# Patient Record
Sex: Male | Born: 1965 | Race: White | Hispanic: No | Marital: Married | Smoking: Former smoker
Health system: Southern US, Community
[De-identification: ages and names within clinical notes are randomized; demographics above are authoritative.]

## PROBLEM LIST (undated history)

## (undated) DIAGNOSIS — M5416 Radiculopathy, lumbar region: Secondary | ICD-10-CM

## (undated) DIAGNOSIS — K219 Gastro-esophageal reflux disease without esophagitis: Secondary | ICD-10-CM

## (undated) DIAGNOSIS — Z8614 Personal history of Methicillin resistant Staphylococcus aureus infection: Secondary | ICD-10-CM

## (undated) HISTORY — PX: LASIK: SHX215

## (undated) HISTORY — PX: CYSTOSCOPY W/ SPINCTEROTOMY: SUR378

---

## 2010-02-20 DIAGNOSIS — Z8614 Personal history of Methicillin resistant Staphylococcus aureus infection: Secondary | ICD-10-CM

## 2010-02-20 HISTORY — DX: Personal history of Methicillin resistant Staphylococcus aureus infection: Z86.14

## 2013-08-15 ENCOUNTER — Ambulatory Visit: Payer: Self-pay

## 2013-08-29 ENCOUNTER — Ambulatory Visit: Payer: Self-pay | Admitting: Family Medicine

## 2015-03-15 ENCOUNTER — Ambulatory Visit: Payer: Self-pay | Admitting: Family Medicine

## 2015-05-03 ENCOUNTER — Ambulatory Visit: Payer: Self-pay | Admitting: Family Medicine

## 2015-08-12 ENCOUNTER — Inpatient Hospital Stay: Admission: RE | Admit: 2015-08-12 | Payer: Self-pay | Source: Ambulatory Visit

## 2015-09-02 ENCOUNTER — Ambulatory Visit: Admission: RE | Admit: 2015-09-02 | Payer: BLUE CROSS/BLUE SHIELD | Source: Ambulatory Visit

## 2015-09-09 ENCOUNTER — Inpatient Hospital Stay: Admission: RE | Admit: 2015-09-09 | Payer: BLUE CROSS/BLUE SHIELD | Source: Ambulatory Visit

## 2015-09-09 ENCOUNTER — Encounter: Payer: Self-pay | Admitting: *Deleted

## 2015-09-10 ENCOUNTER — Encounter: Admission: RE | Payer: Self-pay | Source: Ambulatory Visit

## 2015-09-10 ENCOUNTER — Ambulatory Visit: Admission: RE | Admit: 2015-09-10 | Payer: BLUE CROSS/BLUE SHIELD | Source: Ambulatory Visit | Admitting: Surgery

## 2015-09-10 ENCOUNTER — Encounter: Payer: Self-pay | Admitting: *Deleted

## 2015-09-10 SURGERY — REPAIR, HERNIA, INGUINAL, ADULT
Anesthesia: Choice | Laterality: Bilateral

## 2015-09-10 SURGERY — REPAIR, HERNIA, INGUINAL, BILATERAL, ADULT
Anesthesia: Choice | Laterality: Bilateral

## 2015-09-10 NOTE — Patient Instructions (Signed)
  Your procedure is scheduled on: 09-16-15 [facility](THURSDAY) Report to Same Day Surgery 2nd floor medical mall To find out your arrival time please call [phone] between 1PM - 3PM on 09-15-15 [facility](WEDNESDAY)  Remember: Instructions that are not followed completely may result in serious medical risk, up to and including death, or upon the discretion of your surgeon and anesthesiologist your surgery may need to be rescheduled.    _x___ 1. Do not eat food or drink liquids after midnight. No gum chewing or hard candies.     __x__ 2. No Alcohol for 24 hours before or after surgery.   __x__3. No Smoking for 24 prior to surgery.   ____  4. Bring all medications with you on the day of surgery if instructed.    __x__ 5. Notify your doctor if there is any change in your medical condition     (cold, fever, infections).     Do not wear jewelry, make-up, hairpins, clips or nail polish.  Do not wear lotions, powders, or perfumes. You may wear deodorant.  Do not shave 48 hours prior to surgery. Men may shave face and neck.  Do not bring valuables to the hospital.    [facility] any belongings or valuables.               Contacts, dentures or bridgework may not be worn into surgery.  Leave your suitcase in the car. After surgery it may be brought to your room.  For patients admitted to the hospital, discharge time is determined by your treatment team.   Patients discharged the day of surgery will not be allowed to drive home.    Please read over the following fact sheets that you were given:   [facility] Preparing for [facility] and or MRSA Information   _x___ Take these medicines the morning of surgery with A SIP OF WATER:    1. ZANTAC  2. TAKE A ZANTAC AT BEDTIME ON Wednesday NIGHT  3.  4.  5.  6.  ____ Fleet Enema (as directed)   _x___ Use CHG Soap or sage wipes as directed on instruction sheet   ____ Use inhalers on the day of surgery and bring to hospital day of  surgery  ____ Stop metformin 2 days prior to surgery    ____ Take 1/2 of usual insulin dose the night before surgery and none on the morning of  surgery.   ____ Stop aspirin or coumadin, or plavix  _x__ Stop Anti-inflammatories such as Advil, Aleve, Ibuprofen, Motrin, Naproxen,          Naprosyn, Goodies powders or aspirin products. Ok to take Tylenol.   ____ Stop supplements until after surgery.    ____ Bring C-Pap to the hospital.

## 2015-09-14 ENCOUNTER — Inpatient Hospital Stay: Admission: RE | Admit: 2015-09-14 | Payer: BLUE CROSS/BLUE SHIELD | Source: Ambulatory Visit

## 2015-09-15 ENCOUNTER — Encounter
Admission: RE | Admit: 2015-09-15 | Discharge: 2015-09-15 | Disposition: A | Discharge status: Home / Self Care | Payer: BLUE CROSS/BLUE SHIELD | Source: Ambulatory Visit | Attending: Surgery | Admitting: Surgery

## 2015-09-15 DIAGNOSIS — Z79899 Other long term (current) drug therapy: Secondary | ICD-10-CM | POA: Diagnosis not present

## 2015-09-15 DIAGNOSIS — Z8614 Personal history of Methicillin resistant Staphylococcus aureus infection: Secondary | ICD-10-CM | POA: Diagnosis not present

## 2015-09-15 DIAGNOSIS — Z9889 Other specified postprocedural states: Secondary | ICD-10-CM | POA: Diagnosis not present

## 2015-09-15 DIAGNOSIS — Z87891 Personal history of nicotine dependence: Secondary | ICD-10-CM | POA: Diagnosis not present

## 2015-09-15 DIAGNOSIS — K4020 Bilateral inguinal hernia, without obstruction or gangrene, not specified as recurrent: Secondary | ICD-10-CM | POA: Diagnosis not present

## 2015-09-15 DIAGNOSIS — K219 Gastro-esophageal reflux disease without esophagitis: Secondary | ICD-10-CM | POA: Diagnosis not present

## 2015-09-15 LAB — SURGICAL PCR SCREEN
MRSA, PCR: NEGATIVE
Staphylococcus aureus: NEGATIVE

## 2015-09-16 ENCOUNTER — Ambulatory Visit: Payer: BLUE CROSS/BLUE SHIELD | Admitting: Anesthesiology

## 2015-09-16 ENCOUNTER — Ambulatory Visit
Admission: RE | Admit: 2015-09-16 | Discharge: 2015-09-16 | Disposition: A | Discharge status: Home / Self Care | Payer: BLUE CROSS/BLUE SHIELD | Source: Ambulatory Visit | Attending: Surgery | Admitting: Surgery

## 2015-09-16 ENCOUNTER — Encounter: Payer: Self-pay | Admitting: *Deleted

## 2015-09-16 ENCOUNTER — Encounter
Admission: RE | Disposition: A | Discharge status: Home / Self Care | Payer: Self-pay | Source: Ambulatory Visit | Attending: Surgery

## 2015-09-16 DIAGNOSIS — K219 Gastro-esophageal reflux disease without esophagitis: Secondary | ICD-10-CM | POA: Insufficient documentation

## 2015-09-16 DIAGNOSIS — Z8614 Personal history of Methicillin resistant Staphylococcus aureus infection: Secondary | ICD-10-CM | POA: Insufficient documentation

## 2015-09-16 DIAGNOSIS — Z87891 Personal history of nicotine dependence: Secondary | ICD-10-CM | POA: Insufficient documentation

## 2015-09-16 DIAGNOSIS — Z79899 Other long term (current) drug therapy: Secondary | ICD-10-CM | POA: Insufficient documentation

## 2015-09-16 DIAGNOSIS — K4020 Bilateral inguinal hernia, without obstruction or gangrene, not specified as recurrent: Secondary | ICD-10-CM | POA: Insufficient documentation

## 2015-09-16 DIAGNOSIS — Z9889 Other specified postprocedural states: Secondary | ICD-10-CM | POA: Insufficient documentation

## 2015-09-16 HISTORY — DX: Gastro-esophageal reflux disease without esophagitis: K21.9

## 2015-09-16 HISTORY — PX: INGUINAL HERNIA REPAIR: SHX194

## 2015-09-16 HISTORY — DX: Personal history of Methicillin resistant Staphylococcus aureus infection: Z86.14

## 2015-09-16 SURGERY — REPAIR, HERNIA, INGUINAL, BILATERAL, ADULT
Anesthesia: General | Laterality: Bilateral | Wound class: Clean

## 2015-09-16 MED ORDER — CEFAZOLIN SODIUM-DEXTROSE 2-4 GM/100ML-% IV SOLN
2.0000 g | Freq: Once | INTRAVENOUS | Status: AC
  Administered 2015-09-16: 2 g via INTRAVENOUS

## 2015-09-16 MED ORDER — LABETALOL HCL 5 MG/ML IV SOLN
INTRAVENOUS | Status: DC | PRN
  Administered 2015-09-16: 5 mg via INTRAVENOUS
  Administered 2015-09-16: 10 mg via INTRAVENOUS

## 2015-09-16 MED ORDER — HYDROCODONE-ACETAMINOPHEN 10-325 MG PO TABS
ORAL_TABLET | ORAL | Status: DC
Start: 2015-09-16 — End: 2015-09-16
  Filled 2015-09-16: qty 1

## 2015-09-16 MED ORDER — BUPIVACAINE-EPINEPHRINE (PF) 0.5% -1:200000 IJ SOLN
INTRAMUSCULAR | Status: DC | PRN
  Administered 2015-09-16 (×2): 15 mL via PERINEURAL

## 2015-09-16 MED ORDER — BUPIVACAINE-EPINEPHRINE (PF) 0.5% -1:200000 IJ SOLN
INTRAMUSCULAR | Status: AC
  Filled 2015-09-16: qty 30

## 2015-09-16 MED ORDER — GLYCOPYRROLATE 0.2 MG/ML IJ SOLN
INTRAMUSCULAR | Status: DC | PRN
  Administered 2015-09-16: 0.6 mg via INTRAVENOUS

## 2015-09-16 MED ORDER — ROCURONIUM BROMIDE 100 MG/10ML IV SOLN: INTRAVENOUS | Status: DC | PRN

## 2015-09-16 MED ORDER — FENTANYL CITRATE (PF) 100 MCG/2ML IJ SOLN
25.0000 ug | INTRAMUSCULAR | Status: AC | PRN
  Administered 2015-09-16 (×6): 25 ug via INTRAVENOUS

## 2015-09-16 MED ORDER — NEOSTIGMINE METHYLSULFATE 10 MG/10ML IV SOLN
INTRAVENOUS | Status: DC | PRN
  Administered 2015-09-16: 3 mg via INTRAVENOUS

## 2015-09-16 MED ORDER — HYDROCODONE-ACETAMINOPHEN 5-325 MG PO TABS: 1.0000 | ORAL_TABLET | ORAL | 0 refills | Status: DC | PRN

## 2015-09-16 MED ORDER — LIDOCAINE HCL (CARDIAC) 20 MG/ML IV SOLN
INTRAVENOUS | Status: DC | PRN
  Administered 2015-09-16: 40 mg via INTRAVENOUS

## 2015-09-16 MED ORDER — CEFAZOLIN SODIUM-DEXTROSE 2-4 GM/100ML-% IV SOLN
INTRAVENOUS | Status: AC
  Filled 2015-09-16: qty 100

## 2015-09-16 MED ORDER — ONDANSETRON HCL 4 MG/2ML IJ SOLN: 4.0000 mg | Freq: Once | INTRAMUSCULAR | Status: DC | PRN

## 2015-09-16 MED ORDER — ROCURONIUM BROMIDE 100 MG/10ML IV SOLN
INTRAVENOUS | Status: DC | PRN
  Administered 2015-09-16: 5 mg via INTRAVENOUS
  Administered 2015-09-16: 50 mg via INTRAVENOUS

## 2015-09-16 MED ORDER — LACTATED RINGERS IV SOLN
INTRAVENOUS | Status: DC
  Administered 2015-09-16 (×2): via INTRAVENOUS

## 2015-09-16 MED ORDER — PROPOFOL 10 MG/ML IV BOLUS
INTRAVENOUS | Status: DC | PRN
  Administered 2015-09-16: 200 mg via INTRAVENOUS

## 2015-09-16 MED ORDER — FENTANYL CITRATE (PF) 100 MCG/2ML IJ SOLN
INTRAMUSCULAR | Status: AC
  Administered 2015-09-16: 25 ug via INTRAVENOUS
  Filled 2015-09-16: qty 2

## 2015-09-16 MED ORDER — HYDROCODONE-ACETAMINOPHEN 5-325 MG PO TABS
1.0000 | ORAL_TABLET | ORAL | Status: DC | PRN
  Administered 2015-09-16: 1 via ORAL

## 2015-09-16 MED ORDER — MIDAZOLAM HCL 2 MG/2ML IJ SOLN
INTRAMUSCULAR | Status: DC | PRN
  Administered 2015-09-16: 2 mg via INTRAVENOUS

## 2015-09-16 MED ORDER — FENTANYL CITRATE (PF) 100 MCG/2ML IJ SOLN
INTRAMUSCULAR | Status: DC | PRN
  Administered 2015-09-16: 25 ug via INTRAVENOUS
  Administered 2015-09-16: 50 ug via INTRAVENOUS
  Administered 2015-09-16: 25 ug via INTRAVENOUS
  Administered 2015-09-16 (×2): 50 ug via INTRAVENOUS

## 2015-09-16 SURGICAL SUPPLY — 26 items
BLADE SURG 15 STRL LF DISP TIS (BLADE) ×1 IMPLANT
BLADE SURG 15 STRL SS (BLADE) ×1
CANISTER SUCT 1200ML W/VALVE (MISCELLANEOUS) ×2 IMPLANT
CHLORAPREP W/TINT 26ML (MISCELLANEOUS) ×2 IMPLANT
DRAIN PENROSE 5/8X18 LTX STRL (WOUND CARE) ×2 IMPLANT
DRAPE LAPAROTOMY 77X122 PED (DRAPES) ×2 IMPLANT
ELECT REM PT RETURN 9FT ADLT (ELECTROSURGICAL) ×2
ELECTRODE REM PT RTRN 9FT ADLT (ELECTROSURGICAL) ×1 IMPLANT
GLOVE BIO SURGEON STRL SZ7.5 (GLOVE) ×6 IMPLANT
GOWN STRL REUS W/ TWL LRG LVL3 (GOWN DISPOSABLE) ×3 IMPLANT
GOWN STRL REUS W/TWL LRG LVL3 (GOWN DISPOSABLE) ×3
KIT RM TURNOVER STRD PROC AR (KITS) ×2 IMPLANT
LABEL OR SOLS (LABEL) IMPLANT
LIQUID BAND (GAUZE/BANDAGES/DRESSINGS) ×4 IMPLANT
MESH SYNTHETIC 4X6 SOFT BARD (Mesh General) ×1 IMPLANT
MESH SYNTHETIC SOFT BARD 4X6 (Mesh General) ×1 IMPLANT
NEEDLE HYPO 25X1 1.5 SAFETY (NEEDLE) ×2 IMPLANT
NS IRRIG 500ML POUR BTL (IV SOLUTION) ×2 IMPLANT
PACK BASIN MINOR ARMC (MISCELLANEOUS) ×2 IMPLANT
SPONGE XRAY 4X4 16PLY STRL (MISCELLANEOUS) ×2 IMPLANT
SUT CHROMIC 4 0 RB 1X27 (SUTURE) ×4 IMPLANT
SUT MNCRL AB 4-0 PS2 18 (SUTURE) ×4 IMPLANT
SUT SURGILON 0 30 BLK (SUTURE) ×12 IMPLANT
SUT VIC AB 4-0 SH 27 (SUTURE) ×2
SUT VIC AB 4-0 SH 27XANBCTRL (SUTURE) ×2 IMPLANT
SYRINGE 10CC LL (SYRINGE) ×2 IMPLANT

## 2015-09-16 NOTE — Anesthesia Procedure Notes (Signed)
Date/Time: 09/16/2015 7:48 AM Performed by: Charna Busman

## 2015-09-16 NOTE — Progress Notes (Signed)
Pain level at

## 2015-09-16 NOTE — Anesthesia Preprocedure Evaluation (Signed)
Anesthesia Evaluation  Patient identified by MRN, date of birth, ID band Patient awake    Reviewed: Allergy & Precautions, NPO status , Patient's Chart, lab work & pertinent test results, reviewed documented beta blocker date and time   Airway Mallampati: II  TM Distance: >3 FB     Dental  (+) Chipped   Pulmonary former smoker,           Cardiovascular      Neuro/Psych    GI/Hepatic GERD  Controlled,  Endo/Other    Renal/GU      Musculoskeletal   Abdominal   Peds  Hematology   Anesthesia Other Findings   Reproductive/Obstetrics                             Anesthesia Physical Anesthesia Plan  ASA: II  Anesthesia Plan: General   Post-op Pain Management:    Induction: Intravenous  Airway Management Planned: LMA  Additional Equipment:   Intra-op Plan:   Post-operative Plan:   Informed Consent: I have reviewed the patients History and Physical, chart, labs and discussed the procedure including the risks, benefits and alternatives for the proposed anesthesia with the patient or authorized representative who has indicated his/her understanding and acceptance.     Plan Discussed with: CRNA  Anesthesia Plan Comments:         Anesthesia Quick Evaluation

## 2015-09-16 NOTE — H&P (Signed)
Russell Diaz is an 50 y.o. male.   Chief Complaint: Bulging in the right groin HPI: He recently came into the office complaining of bulging in the right groin. He had had minimal discomfort at that site. Physical findings demonstrated bilateral inguinal hernias. The bulge on the right was 4 cm and the bulge on the left was 3 cm. Is recommended for definitive treatment.  Past Medical History:  Diagnosis Date  . GERD (gastroesophageal reflux disease)   . Hx MRSA infection 2012    Past Surgical History:  Procedure Laterality Date  . CYSTOSCOPY W/ SPINCTEROTOMY      History reviewed. No pertinent family history. Social History:  reports that he quit smoking about 30 years ago. His smoking use included Cigarettes and E-cigarettes. He has a 10.00 pack-year smoking history. He quit smokeless tobacco use about 5 weeks ago. His smokeless tobacco use included Chew. He reports that he drinks alcohol. He reports that he does not use drugs.  Allergies: No Known Allergies  Medications Prior to Admission  Medication Sig Dispense Refill  . ranitidine (ZANTAC) 150 MG tablet Take 150 mg by mouth as needed for heartburn.      Results for orders placed or performed during the hospital encounter of 09/15/15 (from the past 48 hour(s))  Surgical pcr screen     Status: None   Collection Time: 09/15/15  1:16 PM  Result Value Ref Range   MRSA, PCR NEGATIVE NEGATIVE   Staphylococcus aureus NEGATIVE NEGATIVE    Comment:        The Xpert SA Assay (FDA approved for NASAL specimens in patients over 73 years of age), is one component of a comprehensive surveillance program.  Test performance has been validated by [facility] for patients greater than or equal to 67 year old. It is not intended to diagnose infection nor to guide or monitor treatment.    No results found.  ROS he reports no change in overall condition since last visit. He reports no difficulties breathing no cough cold or sore throat.  Review of systems otherwise negative.  Blood pressure (!) 138/100, pulse 78, temperature 97.8 F (36.6 C), temperature source Tympanic, resp. rate 18, height 5\' 10"  (1.778 m), weight 89.4 kg (197 lb), SpO2 98 %. Physical Exam  GENERAL:  Awake alert and oriented and in no acute distress.  LUNGS:  Clear without rales rhonchi or wheezes.  HEART:  Regular rhythm S1-S2, without murmur .  ABDOMEN: Hernias are currently reduced  NEUROLOGICAL: Awake alert moving all extremities  EXTREMITIES: No dependent edema  CLINICAL DATA: Lab work noted  Assessment/Plan Bilateral inguinal hernia repair. I have discussed the operation and care  Renda Rolls, MD 09/16/2015, 7:28 AM

## 2015-09-16 NOTE — Discharge Instructions (Addendum)
Take Tylenol or Norco if needed for pain. ° °Should not drive or do anything dangerous when taking Norco. ° °May shower and blot dry. ° °Avoid straining and heavy lifting. ° °AMBULATORY SURGERY  °DISCHARGE INSTRUCTIONS ° ° °1) The drugs that you were given will stay in your system until tomorrow so for the next 24 hours you should not: ° °A) Drive an automobile °B) Make any legal decisions °C) Drink any alcoholic beverage ° ° °2) You may resume regular meals tomorrow.  Today it is better to start with liquids and gradually work up to solid foods. ° °You may eat anything you prefer, but it is better to start with liquids, then soup and crackers, and gradually work up to solid foods. ° ° °3) Please notify your doctor immediately if you have any unusual bleeding, trouble breathing, redness and pain at the surgery site, drainage, fever, or pain not relieved by medication. ° °4) Additional Instructions: ° ° °Please contact your physician with any problems or Same Day Surgery at [phone], Monday through Friday 6 am to 4 pm, or  at [facility] Main number at [phone]. °

## 2015-09-16 NOTE — Anesthesia Procedure Notes (Signed)
Procedure Name: Intubation Date/Time: 09/16/2015 7:48 AM Performed by: Charna Busman Pre-anesthesia Checklist: Patient identified, Emergency Drugs available, Suction available, Patient being monitored and Timeout performed Patient Re-evaluated:Patient Re-evaluated prior to inductionOxygen Delivery Method: Circle system utilized Preoxygenation: Pre-oxygenation with 100% oxygen Intubation Type: Combination inhalational/ intravenous induction Ventilation: Mask ventilation without difficulty Laryngoscope Size: Miller and 3 Grade View: Grade II Tube type: Oral Tube size: 7.5 mm Number of attempts: 1 Airway Equipment and Method: Stylet Placement Confirmation: ETT inserted through vocal cords under direct vision,  positive ETCO2,  CO2 detector and breath sounds checked- equal and bilateral Secured at: 24 cm Tube secured with: Tape Dental Injury: Teeth and Oropharynx as per pre-operative assessment

## 2015-09-16 NOTE — Anesthesia Procedure Notes (Signed)
Performed by: Jhoana Upham       

## 2015-09-16 NOTE — Op Note (Signed)
OPERATIVE REPORT  PREOPERATIVE DIAGNOSIS: bilateral inguinal hernia  POSTOPERATIVE DIAGNOSIS:bilateral  inguinal hernia  PROCEDURE:  bilateral inguinal hernia repair  ANESTHESIA:  General  SURGEON:  Renda Rolls M.D.  INDICATIONS: He came in the office reporting bulging in the right groin. On physical exam he had findings of bilateral inguinal hernias somewhat larger on the right side. Surgery was recommended for definitive treatment.  With the patient on the operating table in the supine position the bilateral lower quadrant was prepared with clippers and with ChloraPrep and draped in a sterile manner. A transversely oriented right suprapubic incision was made and carried down through subcutaneous tissues. Electrocautery was used for hemostasis. The Scarpa's fascia was incised. The external oblique aponeurosis was incised along the course of its fibers to open the external ring and expose the inguinal cord structures. The cord structures were mobilized. A Penrose drain was passed around the cord structures for traction. Cremaster fibers were separated and examine the cord structures. There was no indirect hernia. There was however a finding of a direct inguinal hernia the defect itself was approximately 2.8 cm. A circumferential incision was made in the attenuated transversalis fascia which was removed. The hernia was inverted. The repair was carried out with 0 Surgilon sutures suturing the conjoined tendon to the shelving edge of the inguinal ligament incorporating transversalis fascia into the repair. The last stitch led to satisfactory narrowing of the internal ring.  Bard soft mesh was cut to create an oval shape and was placed over the repair. This was sutured to the repair with interrupted 0 Surgilon sutures and also sutured medially to the deep fascia and on both sides of the internal ring. Next after seeing hemostasis was intact the cord structures were replaced along the floor of the inguinal  canal. The cut edges of the external oblique aponeurosis were closed with a running 4-0 Vicryl suture to re-create the external ring. The deep fascia superior and lateral to the repair site was infiltrated with half percent Sensorcaine with epinephrine. Subcutaneous tissues were also infiltrated. The Scarpa's fascia was closed with interrupted 4-0 Vicryl sutures. The skin was closed with running 4-0 Monocryl subcuticular suture and LiquiBand.   Attention was turned to the left side. A transversely oriented left suprapubic incision was made and carried down through subcutaneous tissues.Electrocautery was used for hemostasis. The Scarpa's fascia was incised. The external oblique aponeurosis was incised along the course of its fibers to open the external ring and expose the inguinal cord structures. The cord structures were mobilized. A Penrose drain was passed around the cord structures for traction. Cremaster fibers were separated and examine the cord structures. Cremaster fibers were separated. There was an indirect hernia sac containing mostly fatty tissue which was dissected free from surrounding structures and was inverted back into the peritoneal cavity.The repair was carried out with 0 Surgilon sutures suturing the conjoined tendon to the shelving edge of the inguinal ligament incorporating transversalis fascia into the repair. The last stitch led to satisfactory narrowing of the internal ring.  Bard soft mesh was cut to create an oval shape and was placed over the repair. This was sutured to the repair with interrupted 0 Surgilon sutures and also sutured medially to the deep fascia and on both sides of the internal ring. Next after seeing hemostasis was intact the cord structures were replaced along the floor of the inguinal canal. The cut edges of the external oblique aponeurosis were closed with a running 4-0 Vicryl suture to re-create the  external ring. The deep fascia superior and lateral to the repair  site was infiltrated with half percent Sensorcaine with epinephrine. Subcutaneous tissues were also infiltrated. The Scarpa's fascia was closed with interrupted 4-0 Vicryl sutures. The skin was closed with running 4-0 Monocryl subcuticular suture and LiquiBand. The testicles remained in the scrotum.   The patient appeared to be in satisfactory condition and was prepared for transfer to the recovery room.  Renda Rolls M.D.

## 2015-09-16 NOTE — Progress Notes (Signed)
Dr. Smith into see 

## 2015-09-16 NOTE — Transfer of Care (Signed)
Immediate Anesthesia Transfer of Care Note  Patient: Russell Diaz  Procedure(s) Performed: Procedure(s): HERNIA REPAIR INGUINAL ADULT BILATERAL (Bilateral)  Patient Location: PACU  Anesthesia Type:General  Level of Consciousness: awake, oriented and patient cooperative  Airway & Oxygen Therapy: Patient Spontanous Breathing  Post-op Assessment: Report given to RN and Post -op Vital signs reviewed and stable  Post vital signs: Reviewed and stable  Last Vitals:  Vitals:   09/16/15 0649 09/16/15 1046  BP: (!) 138/100 (!) 133/101  Pulse: 78 97  Resp: 18 15  Temp: 36.6 C 36.9 C    Last Pain:  Vitals:   09/16/15 0649  TempSrc: Tympanic  PainSc: 7          Complications: No apparent anesthesia complications

## 2015-09-16 NOTE — Anesthesia Postprocedure Evaluation (Signed)
Anesthesia Post Note  Patient: Russell Diaz  Procedure(s) Performed: Procedure(s) (LRB): HERNIA REPAIR INGUINAL ADULT BILATERAL (Bilateral)  Patient location during evaluation: PACU Anesthesia Type: General Level of consciousness: awake and alert Pain management: pain level controlled Vital Signs Assessment: post-procedure vital signs reviewed and stable Respiratory status: spontaneous breathing, nonlabored ventilation, respiratory function stable and patient connected to nasal cannula oxygen Cardiovascular status: blood pressure returned to baseline and stable Postop Assessment: no signs of nausea or vomiting Anesthetic complications: no    Last Vitals:  Vitals:   09/16/15 1148 09/16/15 1200  BP: (!) 133/94 131/80  Pulse: 67 78  Resp: 16   Temp:      Last Pain:  Vitals:   09/16/15 1146  TempSrc: Tympanic  PainSc: 4                  Viveca Beckstrom S

## 2015-09-17 ENCOUNTER — Ambulatory Visit: Payer: Self-pay | Admitting: Family Medicine

## 2016-07-14 ENCOUNTER — Ambulatory Visit (INDEPENDENT_AMBULATORY_CARE_PROVIDER_SITE_OTHER): Payer: BC Managed Care – PPO | Admitting: Internal Medicine

## 2016-07-14 ENCOUNTER — Encounter: Payer: Self-pay | Admitting: Internal Medicine

## 2016-07-14 VITALS — BP 124/82 | HR 68 | Temp 97.9°F | Ht 68.25 in | Wt 201.5 lb

## 2016-07-14 DIAGNOSIS — M79605 Pain in left leg: Secondary | ICD-10-CM

## 2016-07-14 DIAGNOSIS — M79604 Pain in right leg: Secondary | ICD-10-CM | POA: Diagnosis not present

## 2016-07-14 DIAGNOSIS — K219 Gastro-esophageal reflux disease without esophagitis: Secondary | ICD-10-CM | POA: Diagnosis not present

## 2016-07-14 NOTE — Patient Instructions (Signed)
Food Choices for Gastroesophageal Reflux Disease, Adult When you have gastroesophageal reflux disease (GERD), the foods you eat and your eating habits are very important. Choosing the right foods can help ease your discomfort. What guidelines do I need to follow?  Choose fruits, vegetables, whole grains, and low-fat dairy products.  Choose low-fat meat, fish, and poultry.  Limit fats such as oils, salad dressings, butter, nuts, and avocado.  Keep a food diary. This helps you identify foods that cause symptoms.  Avoid foods that cause symptoms. These may be different for everyone.  Eat small meals often instead of 3 large meals a day.  Eat your meals slowly, in a place where you are relaxed.  Limit fried foods.  Cook foods using methods other than frying.  Avoid drinking alcohol.  Avoid drinking large amounts of liquids with your meals.  Avoid bending over or lying down until 2-3 hours after eating. What foods are not recommended? These are some foods and drinks that may make your symptoms worse: Vegetables  Tomatoes. Tomato juice. Tomato and spaghetti sauce. Chili peppers. Onion and garlic. Horseradish. Fruits  Oranges, grapefruit, and lemon (fruit and juice). Meats  High-fat meats, fish, and poultry. This includes hot dogs, ribs, ham, sausage, salami, and bacon. Dairy  Whole milk and chocolate milk. Sour cream. Cream. Butter. Ice cream. Cream cheese. Drinks  Coffee and tea. Bubbly (carbonated) drinks or energy drinks. Condiments  Hot sauce. Barbecue sauce. Sweets/Desserts  Chocolate and cocoa. Donuts. Peppermint and spearmint. Fats and Oils  High-fat foods. This includes French fries and potato chips. Other  Vinegar. Strong spices. This includes black pepper, white pepper, red pepper, cayenne, curry powder, cloves, ginger, and chili powder. The items listed above may not be a complete list of foods and drinks to avoid. Contact your dietitian for more information.    This information is not intended to replace advice given to you by your health care provider. Make sure you discuss any questions you have with your health care provider. Document Released: 08/08/2011 Document Revised: 07/15/2015 Document Reviewed: 12/11/2012 Elsevier Interactive Patient Education  2017 [employer]  

## 2016-07-14 NOTE — Progress Notes (Signed)
HPI  Pt presents to the clinic today to establish care and for management of the conditions listed below. [facility]  GERD: He is not sure triggers this. He takes Zantac as needed with good relief.  He also c/o bilateral leg pain. He reports this started in the last few weeks. He describes the pain as sore and achy. He denies numbness or tingling in his feet. The pain is not worse with ambulation. It is better with stretching. He has tried Constellation Brandsoody's Powders with some relief.  Flu: 11/2014 Tetanus: unsure PSA screening: never Colon Screening: never Vision Screening: as needed Dentist: as needed  Past Medical History:  Diagnosis Date  . GERD (gastroesophageal reflux disease)   . Hx MRSA infection 2012    Current Outpatient Prescriptions  Medication Sig Dispense Refill  . ranitidine (ZANTAC) 150 MG tablet Take 150 mg by mouth as needed for heartburn.     No current facility-administered medications for this visit.     No Known Allergies  Family History  Problem Relation Age of Onset  . Heart disease Father   . Diabetes Paternal Uncle     Social History   Social History  . Marital status: Married    Spouse name: N/A  . Number of children: N/A  . Years of education: N/A   Occupational History  . Not on file.   Social History Main Topics  . Smoking status: Former Smoker    Packs/day: 1.00    Years: 10.00    Types: Cigarettes, E-cigarettes    Quit date: 09/09/1985  . Smokeless tobacco: Former NeurosurgeonUser    Types: Chew    Quit date: 08/11/2015     Comment: Last used e Cigarette 09/15/15  . Alcohol use Yes     Comment: OCC  . Drug use: No  . Sexual activity: Yes   Other Topics Concern  . Not on file   Social History Narrative  . No narrative on file    ROS:  Constitutional: Denies fever, malaise, fatigue, headache or abrupt weight changes.  HEENT: Denies eye pain, eye redness, ear pain, ringing in the ears, wax buildup, runny  nose, nasal congestion, bloody nose, or sore throat. Respiratory: Denies difficulty breathing, shortness of breath, cough or sputum production.   Cardiovascular: Denies chest pain, chest tightness, palpitations or swelling in the hands or feet.  Gastrointestinal: Pt reports intermittent reflux. Denies abdominal pain, bloating, constipation, diarrhea or blood in the stool.  GU: Denies frequency, urgency, pain with urination, blood in urine, odor or discharge. Musculoskeletal: Pt reports bilateral leg pain. Denies decrease in range of motion, difficulty with gait, or joint pain and swelling.  Skin: Denies redness, rashes, lesions or ulcercations.  Neurological: Denies dizziness, difficulty with memory, difficulty with speech or problems with balance and coordination.  Psych: Denies anxiety, depression, SI/HI.  No other specific complaints in a complete review of systems (except as listed in HPI above).  PE:  BP 124/82   Pulse 68   Temp 97.9 F (36.6 C) (Oral)   Ht 5' 8.25" (1.734 m)   Wt 201 lb 8 oz (91.4 kg)   SpO2 96%   BMI 30.41 kg/m  Wt Readings from Last 3 Encounters:  07/14/16 201 lb 8 oz (91.4 kg)  09/16/15 197 lb (89.4 kg)    General: Appears his stated age, well developed, well nourished in NAD. Cardiovascular: Normal rate and rhythm. S1,S2 noted.  No murmur, rubs or gallops noted. No JVD  or BLE edema. No carotid bruits noted. Pulmonary/Chest: Normal effort and positive vesicular breath sounds. No respiratory distress. No wheezes, rales or ronchi noted.  Abdomen: Soft and nontender. Normal bowel sounds. Musculoskeletal: Normal flexion and extension of the knees. No joint swelling noted. Pain with palpation of bilateral calves. No redness, swelling or warmth noted of the calves. No difficulty with gait.  Neurological: Alert and oriented.  Psychiatric: Mood and affect normal. Behavior is normal. Judgment and thought content normal.    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

## 2016-07-14 NOTE — Assessment & Plan Note (Signed)
Continue prn Zantac

## 2016-09-07 ENCOUNTER — Ambulatory Visit (INDEPENDENT_AMBULATORY_CARE_PROVIDER_SITE_OTHER): Payer: BC Managed Care – PPO | Admitting: Internal Medicine

## 2016-09-07 ENCOUNTER — Encounter: Payer: Self-pay | Admitting: Internal Medicine

## 2016-09-07 ENCOUNTER — Encounter (INDEPENDENT_AMBULATORY_CARE_PROVIDER_SITE_OTHER): Payer: Self-pay

## 2016-09-07 VITALS — BP 122/76 | HR 61 | Temp 97.9°F | Ht 68.5 in | Wt 201.5 lb

## 2016-09-07 DIAGNOSIS — Z23 Encounter for immunization: Secondary | ICD-10-CM | POA: Diagnosis not present

## 2016-09-07 DIAGNOSIS — Z125 Encounter for screening for malignant neoplasm of prostate: Secondary | ICD-10-CM | POA: Diagnosis not present

## 2016-09-07 DIAGNOSIS — E7800 Pure hypercholesterolemia, unspecified: Secondary | ICD-10-CM | POA: Diagnosis not present

## 2016-09-07 DIAGNOSIS — Z0001 Encounter for general adult medical examination with abnormal findings: Secondary | ICD-10-CM

## 2016-09-07 DIAGNOSIS — K5901 Slow transit constipation: Secondary | ICD-10-CM

## 2016-09-07 LAB — CBC
HCT: 45.6 % (ref 39.0–52.0)
Hemoglobin: 15.6 g/dL (ref 13.0–17.0)
MCHC: 34.3 g/dL (ref 30.0–36.0)
MCV: 87.2 fl (ref 78.0–100.0)
Platelets: 216 10*3/uL (ref 150.0–400.0)
RBC: 5.23 Mil/uL (ref 4.22–5.81)
RDW: 13.6 % (ref 11.5–15.5)
WBC: 5.1 10*3/uL (ref 4.0–10.5)

## 2016-09-07 LAB — COMPREHENSIVE METABOLIC PANEL
ALBUMIN: 4.4 g/dL (ref 3.5–5.2)
ALT: 21 U/L (ref 0–53)
AST: 18 U/L (ref 0–37)
Alkaline Phosphatase: 65 U/L (ref 39–117)
BUN: 18 mg/dL (ref 6–23)
CHLORIDE: 105 meq/L (ref 96–112)
CO2: 28 meq/L (ref 19–32)
CREATININE: 1.05 mg/dL (ref 0.40–1.50)
Calcium: 9.5 mg/dL (ref 8.4–10.5)
GFR: 79.39 mL/min (ref >60.00)
Glucose, Bld: 96 mg/dL (ref 70–99)
Potassium: 4.6 mEq/L (ref 3.5–5.1)
SODIUM: 138 meq/L (ref 135–145)
Total Bilirubin: 0.6 mg/dL (ref 0.2–1.2)
Total Protein: 6.3 g/dL (ref 6.0–8.3)

## 2016-09-07 LAB — PSA: PSA: 0.65 ng/mL (ref 0.10–4.00)

## 2016-09-07 LAB — LIPID PANEL
CHOL/HDL RATIO: 6
Cholesterol: 244 mg/dL — ABNORMAL HIGH (ref 0–200)
HDL: 40 mg/dL (ref >39.00)
LDL CALC: 180 mg/dL — AB (ref 0–99)
NonHDL: 203.97
Triglycerides: 120 mg/dL (ref 0.0–149.0)
VLDL: 24 mg/dL (ref 0.0–40.0)

## 2016-09-07 LAB — HEMOGLOBIN A1C: HEMOGLOBIN A1C: 5.5 % (ref 4.6–6.5)

## 2016-09-07 NOTE — Addendum Note (Signed)
Addended by: Roena MaladyEVONTENNO, Patrick Salemi Y on: 09/07/2016 02:25 PM   Modules accepted: Orders

## 2016-09-07 NOTE — Progress Notes (Signed)
Subjective:    Patient ID: Russell Diaz, male    DOB: 1965/09/19, 51 y.o.   MRN: [id number]  HPI  Pt presents to the clinic today for his annual exam.  Flu: 11/2014 Tetanus: unsure PSA Screening: never Colon Screening: never Vision Screening: as needed Dentist: as needed  Diet: He is eating meat. He consumes some veggies no fruits. He does not eat fried foods but he does eat high fat foods. He drinks mostly water and coffee Exercise: None  Review of Systems  Past Medical History:  Diagnosis Date  . GERD (gastroesophageal reflux disease)   . Hx MRSA infection 2012    Current Outpatient Prescriptions  Medication Sig Dispense Refill  . ranitidine (ZANTAC) 150 MG tablet Take 150 mg by mouth as needed for heartburn.     No current facility-administered medications for this visit.     No Known Allergies  Family History  Problem Relation Age of Onset  . Heart disease Father   . Diabetes Paternal Uncle     Social History   Social History  . Marital status: Married    Spouse name: N/A  . Number of children: N/A  . Years of education: N/A   Occupational History  . Not on file.   Social History Main Topics  . Smoking status: Former Smoker    Packs/day: 1.00    Years: 10.00    Types: Cigarettes, E-cigarettes    Quit date: 09/09/1985  . Smokeless tobacco: Former NeurosurgeonUser    Types: Chew    Quit date: 08/11/2015     Comment: Last used e Cigarette 09/15/15  . Alcohol use Yes     Comment: OCC  . Drug use: No  . Sexual activity: Yes   Other Topics Concern  . Not on file   Social History Narrative  . No narrative on file     Constitutional: Denies fever, malaise, fatigue, headache or abrupt weight changes.  HEENT: Denies eye pain, eye redness, ear pain, ringing in the ears, wax buildup, runny nose, nasal congestion, bloody nose, or sore throat. Respiratory: Denies difficulty breathing, shortness of breath, cough or sputum production.   Cardiovascular: Denies  chest pain, chest tightness, palpitations or swelling in the hands or feet.  Gastrointestinal: Pt reports constipation. Denies abdominal pain, bloating, diarrhea or blood in the stool.  GU: Denies urgency, frequency, pain with urination, burning sensation, blood in urine, odor or discharge. Musculoskeletal: Pt reports intermittent bilateral leg pain (only when driving). Denies decrease in range of motion, difficulty with gait, muscle pain or joint pain and swelling.  Skin: Denies redness, rashes, lesions or ulcercations.  Neurological: Denies dizziness, difficulty with memory, difficulty with speech or problems with balance and coordination.  Psych: Denies anxiety, depression, SI/HI.  No other specific complaints in a complete review of systems (except as listed in HPI above).     Objective:   Physical Exam   BP 122/76   Pulse 61   Temp 97.9 F (36.6 C) (Oral)   Ht 5' 8.5" (1.74 m)   Wt 201 lb 8 oz (91.4 kg)   SpO2 96%   BMI 30.19 kg/m  Wt Readings from Last 3 Encounters:  09/07/16 201 lb 8 oz (91.4 kg)  07/14/16 201 lb 8 oz (91.4 kg)  09/16/15 197 lb (89.4 kg)    General: Appears his stated age, well developed, well nourished in NAD. Skin: Warm, dry and intact. Acne and acne scars noted on face. HEENT: Head: normal shape and  size; Eyes: sclera white, no icterus, conjunctiva pink, PERRLA and EOMs intact; Ears: Tm's gray and intact, normal light reflex;  Throat/Mouth: Teeth present, mucosa pink and moist, no exudate, lesions or ulcerations noted.  Neck:  Neck supple, trachea midline. No masses, lumps or thyromegaly present.  Cardiovascular: Normal rate and rhythm. S1,S2 noted.  No murmur, rubs or gallops noted. No JVD or BLE edema. No carotid bruits noted. Pulmonary/Chest: Normal effort and positive vesicular breath sounds. No respiratory distress. No wheezes, rales or ronchi noted.  Abdomen: Soft and nontender. Normal bowel sounds. No distention or masses noted. Liver, spleen and  kidneys non palpable. Musculoskeletal: Strength 5/5 BUE/BLE. No difficulty with gait.  Neurological: Alert and oriented. Cranial nerves II-XII grossly intact. Coordination normal.  Psychiatric: Mood and affect normal. Behavior is normal. Judgment and thought content normal.       Assessment & Plan:   Preventative Health Maintenance:  Encouraged him to get a flu shot in the fall Tdap today He declines colonoscopy but is agreeable to Cologuard- ordered Encouraged him to consume a balanced diet and exercise regimen Advised him to see an eye doctor and dentist annually Will check CBC, CMET, Lipid, A1C and PSA today He declines STD screening  Constipation:  Secondary to Keto diet Advised him to drink plenty of water Start a probiotic daily or Mirilax every other day  RTC in 1 year, sooner if needed Nicki Reaper, NP

## 2016-09-07 NOTE — Patient Instructions (Signed)

## 2016-09-11 NOTE — Addendum Note (Signed)
Addended by: Roena MaladyEVONTENNO, Shakora Nordquist Y on: 09/11/2016 10:18 AM   Modules accepted: Orders

## 2016-09-14 ENCOUNTER — Telehealth: Payer: Self-pay

## 2016-09-14 NOTE — Telephone Encounter (Signed)
Cologuard form has been faxed w/ demographics and insurance card attached 

## 2016-09-30 LAB — COLOGUARD

## 2016-10-04 ENCOUNTER — Telehealth: Payer: Self-pay

## 2016-10-04 NOTE — Telephone Encounter (Signed)
Letter mailed to pt letting him know his recent results from Cologuard are negative

## 2016-10-12 ENCOUNTER — Encounter: Payer: Self-pay | Admitting: Internal Medicine

## 2016-12-12 ENCOUNTER — Ambulatory Visit (INDEPENDENT_AMBULATORY_CARE_PROVIDER_SITE_OTHER): Payer: BC Managed Care – PPO

## 2016-12-12 ENCOUNTER — Other Ambulatory Visit (INDEPENDENT_AMBULATORY_CARE_PROVIDER_SITE_OTHER): Payer: BC Managed Care – PPO

## 2016-12-12 DIAGNOSIS — E7800 Pure hypercholesterolemia, unspecified: Secondary | ICD-10-CM | POA: Diagnosis not present

## 2016-12-12 DIAGNOSIS — Z23 Encounter for immunization: Secondary | ICD-10-CM | POA: Diagnosis not present

## 2016-12-13 LAB — LIPID PANEL
CHOLESTEROL: 244 mg/dL — AB (ref 0–200)
HDL: 43.8 mg/dL (ref >39.00)
LDL Cholesterol: 170 mg/dL — ABNORMAL HIGH (ref 0–99)
NonHDL: 199.96
TRIGLYCERIDES: 151 mg/dL — AB (ref 0.0–149.0)
Total CHOL/HDL Ratio: 6
VLDL: 30.2 mg/dL (ref 0.0–40.0)

## 2016-12-14 ENCOUNTER — Other Ambulatory Visit: Payer: Self-pay | Admitting: Internal Medicine

## 2016-12-14 ENCOUNTER — Encounter: Payer: Self-pay | Admitting: Internal Medicine

## 2016-12-14 DIAGNOSIS — E7800 Pure hypercholesterolemia, unspecified: Secondary | ICD-10-CM

## 2016-12-14 MED ORDER — SIMVASTATIN 20 MG PO TABS: 20.0000 mg | ORAL_TABLET | Freq: Every day | ORAL | 2 refills | Status: DC

## 2016-12-20 ENCOUNTER — Ambulatory Visit: Payer: Self-pay

## 2016-12-20 NOTE — Telephone Encounter (Signed)
Pt. Has appointment for 12/21/16 at 4 p.m. With Dr. Ermalene SearingBedsole. Pt instructed if chest pain returns and last more than 3-5 minutes and he has any other symptoms with this to call 911. Verbalizes understanding.

## 2016-12-20 NOTE — Telephone Encounter (Signed)
   Reason for Disposition . [1] Chest pain lasting <= 5 minutes AND [2] NO chest pain or cardiac symptoms now(Exceptions: pains lasting a few seconds)  Answer Assessment - Initial Assessment Questions 1. LOCATION: "Where does it hurt?"       No pain now. 2. RADIATION: "Does the pain go anywhere else?" (e.g., into neck, jaw, arms, back)     Left rib area 3. ONSET: "When did the chest pain begin?" (Minutes, hours or days)      Over I month ago 4. PATTERN "Does the pain come and go, or has it been constant since it started?"  "Does it get worse with exertion?"      Pain comes and goes 5. DURATION: "How long does it last" (e.g., seconds, minutes, hours)     10-15 seconds 6. SEVERITY: "How bad is the pain?"  (e.g., Scale 1-10; mild, moderate, or severe)    - MILD (1-3): doesn't interfere with normal activities     - MODERATE (4-7): interferes with normal activities or awakens from sleep    - SEVERE (8-10): excruciating pain, unable to do any normal activities       3-4 7. CARDIAC RISK FACTORS: "Do you have any history of heart problems or risk factors for heart disease?" (e.g., prior heart attack, angina; high blood pressure, diabetes, being overweight, high cholesterol, smoking, or strong family history of heart disease)     High cholesterol 8. PULMONARY RISK FACTORS: "Do you have any history of lung disease?"  (e.g., blood clots in lung, asthma, emphysema, birth control pills)     None 9. CAUSE: "What do you think is causing the chest pain?"     No ideas 10. OTHER SYMPTOMS: "Do you have any other symptoms?" (e.g., dizziness, nausea, vomiting, sweating, fever, difficulty breathing, cough)       None 11. PREGNANCY: "Is there any chance you are pregnant?" "When was your last menstrual period?"       No  Protocols used: CHEST PAIN-A-AH  Pt. States chest pain started about the time he started using vaping to stop smoking about I month ago. States he is very concerned about this. Denies any  other symptoms.

## 2016-12-22 ENCOUNTER — Encounter: Payer: Self-pay | Admitting: Family Medicine

## 2016-12-22 ENCOUNTER — Ambulatory Visit (INDEPENDENT_AMBULATORY_CARE_PROVIDER_SITE_OTHER): Payer: BC Managed Care – PPO | Admitting: Family Medicine

## 2016-12-22 VITALS — BP 120/82 | HR 64 | Temp 97.6°F | Ht 68.5 in | Wt 196.5 lb

## 2016-12-22 DIAGNOSIS — R079 Chest pain, unspecified: Secondary | ICD-10-CM

## 2016-12-22 DIAGNOSIS — K219 Gastro-esophageal reflux disease without esophagitis: Secondary | ICD-10-CM | POA: Diagnosis not present

## 2016-12-22 NOTE — Progress Notes (Signed)
   Subjective:    Patient ID: Russell Diaz, male    DOB: [birth year], 51 y.o.   MRN: [id number]  HPI   51 year old male presents with new onset chest pain x 1 month.  Intermittent,  central chest pain, occ under ribs on left, occ under left arm last  Few seconds, occurs spontaneous.. At rest, occasionally when driving  no change with eating,  No change with arms or head movements.  No SOB, no sweating,  no associated panic  no cough  When exercising he does fine.Marland Kitchen. Has been walking a lot   Occ associated with hear fluttering/palpitations  hx of GERD on zantac prn.  drinks 2 large cups of coffee a day   CV risk:  tobacco: former smoker, but does occ dip has recently started vaping again  family history: father with  MI age 50, MGF CAD  personal history:  No PMH MI/CAD  cholesterol: now on statin has some associated muscle ache.  EKG: normal EKG, normal sinus rhythm, nonspecific ST and T waves changes.   Review of Systems  Constitutional: Negative for fatigue and fever.  HENT: Negative for ear pain.   Eyes: Negative for pain.  Respiratory: Negative for cough and shortness of breath.   Cardiovascular: Positive for chest pain. Negative for palpitations and leg swelling.  Gastrointestinal: Negative for abdominal pain.  Genitourinary: Negative for dysuria.  Musculoskeletal: Negative for arthralgias.  Neurological: Negative for syncope, light-headedness and headaches.  Psychiatric/Behavioral: Negative for dysphoric mood.       Objective:   Physical Exam  Constitutional: Vital signs are normal. He appears well-developed and well-nourished.  HENT:  Head: Normocephalic.  Right Ear: Hearing normal.  Left Ear: Hearing normal.  Nose: Nose normal.  Mouth/Throat: Oropharynx is clear and moist and mucous membranes are normal.  Neck: Trachea normal. Carotid bruit is not present. No thyroid mass and no thyromegaly present.  Cardiovascular: Normal rate, regular rhythm and normal  pulses. Exam reveals no gallop, no distant heart sounds and no friction rub.  No murmur heard. No peripheral edema  Pulmonary/Chest: Effort normal and breath sounds normal. No respiratory distress.  Skin: Skin is warm, dry and intact. No rash noted.  Psychiatric: He has a normal mood and affect. His speech is normal and behavior is normal. Thought content normal.          Assessment & Plan:

## 2016-12-22 NOTE — Patient Instructions (Addendum)
Start prilosec 20 mg daily 2-4 weeks.  Please stop at the front desk to set up referral. Decrease caffeine  Or change to decaf.  No sign of past or ongoing heart attack on EKG.

## 2016-12-26 ENCOUNTER — Ambulatory Visit (INDEPENDENT_AMBULATORY_CARE_PROVIDER_SITE_OTHER): Payer: BC Managed Care – PPO

## 2016-12-26 DIAGNOSIS — R079 Chest pain, unspecified: Secondary | ICD-10-CM | POA: Diagnosis not present

## 2016-12-29 LAB — EXERCISE TOLERANCE TEST
CHL CUP RESTING HR STRESS: 70 {beats}/min
CSEPED: 8 min
CSEPEW: 10.1 METS
Exercise duration (sec): 19 s
MPHR: 170 {beats}/min
Peak HR: 157 {beats}/min
Percent HR: 92 %

## 2017-01-22 DIAGNOSIS — R079 Chest pain, unspecified: Secondary | ICD-10-CM | POA: Insufficient documentation

## 2017-01-22 NOTE — Assessment & Plan Note (Signed)
Atypical. Not clearly cardiac but pt with moderate CAD risk.Marland Kitchen. Refer for cardiac stress testing.  More likely due to GERD treat with PPI and trigger avoidance.

## 2017-01-22 NOTE — Assessment & Plan Note (Signed)
Start prilosec 20 mg daily 2-4 weeks.  Decrease caffeine  Or change to decaf.

## 2017-03-06 ENCOUNTER — Other Ambulatory Visit: Payer: Self-pay | Admitting: Internal Medicine

## 2017-03-06 DIAGNOSIS — E7800 Pure hypercholesterolemia, unspecified: Secondary | ICD-10-CM

## 2017-03-16 ENCOUNTER — Other Ambulatory Visit: Payer: BC Managed Care – PPO

## 2017-09-10 ENCOUNTER — Encounter: Payer: BC Managed Care – PPO | Admitting: Internal Medicine

## 2017-09-13 ENCOUNTER — Ambulatory Visit (INDEPENDENT_AMBULATORY_CARE_PROVIDER_SITE_OTHER): Payer: BC Managed Care – PPO | Admitting: Internal Medicine

## 2017-09-13 ENCOUNTER — Encounter: Payer: Self-pay | Admitting: Internal Medicine

## 2017-09-13 VITALS — BP 122/78 | HR 69 | Temp 97.8°F | Ht 68.5 in | Wt 186.0 lb

## 2017-09-13 DIAGNOSIS — Z125 Encounter for screening for malignant neoplasm of prostate: Secondary | ICD-10-CM

## 2017-09-13 DIAGNOSIS — Z0000 Encounter for general adult medical examination without abnormal findings: Secondary | ICD-10-CM

## 2017-09-13 DIAGNOSIS — G8929 Other chronic pain: Secondary | ICD-10-CM | POA: Insufficient documentation

## 2017-09-13 DIAGNOSIS — M545 Low back pain, unspecified: Secondary | ICD-10-CM

## 2017-09-13 DIAGNOSIS — E7800 Pure hypercholesterolemia, unspecified: Secondary | ICD-10-CM | POA: Diagnosis not present

## 2017-09-13 DIAGNOSIS — K219 Gastro-esophageal reflux disease without esophagitis: Secondary | ICD-10-CM

## 2017-09-13 DIAGNOSIS — M549 Dorsalgia, unspecified: Secondary | ICD-10-CM

## 2017-09-13 NOTE — Assessment & Plan Note (Addendum)
CBC and CMET today Improved with weight loss Can take Ranitidine prn

## 2017-09-13 NOTE — Assessment & Plan Note (Signed)
Continue Diclofenac CMET today

## 2017-09-13 NOTE — Patient Instructions (Signed)

## 2017-09-13 NOTE — Progress Notes (Signed)
Subjective:    Patient ID: Russell Diaz, male    DOB: 1965-03-25, 52 y.o.   MRN: [id number]  HPI  Pt presents to the clinic today for his annual exam.   HLD: His last LDL 170, 08/2016. He is not taking Simvastatin because he had severe muscle cramps with it. He tries to consume a low fat diet.  GERD: No longer an issue since he lost weight. He no longer takes Ranitidine.  Chronic Low Back Pain: He reports this is caused by bulging discs. He takes Diclofenac daily with good relief. He follows with [facility].   Flu: 11/2016 Tetanus: 08/2016 PSA Screening: 08/2016 Colon Screening: 09/2016 Vision Screening: annually Dentist: as needed  Diet: He does eat lean meat. He consumes fruits and veggies daily. He tries to avoid fried foods. He drinks mostly coffee, water. Exercise: 2-3 days a week, walking for 30 minutes  Review of Systems      Past Medical History:  Diagnosis Date  . GERD (gastroesophageal reflux disease)   . Hx MRSA infection 2012    Current Outpatient Medications  Medication Sig Dispense Refill  . ranitidine (ZANTAC) 150 MG tablet Take 150 mg by mouth as needed for heartburn.    . simvastatin (ZOCOR) 20 MG tablet Take 1 tablet (20 mg total) by mouth at bedtime. 30 tablet 2   No current facility-administered medications for this visit.     No Known Allergies  Family History  Problem Relation Age of Onset  . Heart disease Father   . Diabetes Paternal Uncle     Social History   Socioeconomic History  . Marital status: Married    Spouse name: Not on file  . Number of children: Not on file  . Years of education: Not on file  . Highest education level: Not on file  Occupational History  . Not on file  Social Needs  . Financial resource strain: Not on file  . Food insecurity:    Worry: Not on file    Inability: Not on file  . Transportation needs:    Medical: Not on file    Non-medical: Not on file  Tobacco Use  . Smoking status:  Former Smoker    Packs/day: 1.00    Years: 10.00    Pack years: 10.00    Types: Cigarettes, E-cigarettes    Last attempt to quit: 09/09/1985    Years since quitting: 32.0  . Smokeless tobacco: Former NeurosurgeonUser    Types: Chew    Quit date: 08/11/2015  . Tobacco comment: Last used e Cigarette 09/15/15  Substance and Sexual Activity  . Alcohol use: Yes    Comment: occasional  . Drug use: No  . Sexual activity: Yes  Lifestyle  . Physical activity:    Days per week: Not on file    Minutes per session: Not on file  . Stress: Not on file  Relationships  . Social connections:    Talks on phone: Not on file    Gets together: Not on file    Attends religious service: Not on file    Active member of club or organization: Not on file    Attends meetings of clubs or organizations: Not on file    Relationship status: Not on file  . Intimate partner violence:    Fear of current or ex partner: Not on file    Emotionally abused: Not on file    Physically abused: Not on file  Forced sexual activity: Not on file  Other Topics Concern  . Not on file  Social History Narrative  . Not on file     Constitutional: Denies fever, malaise, fatigue, headache or abrupt weight changes.  HEENT: Denies eye pain, eye redness, ear pain, ringing in the ears, wax buildup, runny nose, nasal congestion, bloody nose, or sore throat. Respiratory: Denies difficulty breathing, shortness of breath, cough or sputum production.   Cardiovascular: Denies chest pain, chest tightness, palpitations or swelling in the hands or feet.  Gastrointestinal: Denies abdominal pain, bloating, constipation, diarrhea or blood in the stool.  GU: Denies urgency, frequency, pain with urination, burning sensation, blood in urine, odor or discharge. Musculoskeletal: Pt reports intermittent back pain. Denies decrease in range of motion, difficulty with gait, muscle pain or joint swelling.  Skin: Denies redness, rashes, lesions or  ulcercations.  Neurological: Denies dizziness, difficulty with memory, difficulty with speech or problems with balance and coordination.  Psych: Denies anxiety, depression, SI/HI.  No other specific complaints in a complete review of systems (except as listed in HPI above).  Objective:   Physical Exam  BP 122/78   Pulse 69   Temp 97.8 F (36.6 C) (Oral)   Ht 5' 8.5" (1.74 m)   Wt 186 lb (84.4 kg)   SpO2 97%   BMI 27.87 kg/m  Wt Readings from Last 3 Encounters:  09/13/17 186 lb (84.4 kg)  12/22/16 196 lb 8 oz (89.1 kg)  09/07/16 201 lb 8 oz (91.4 kg)    General: Appears his stated age, well developed, well nourished in NAD. Skin: Warm, dry and intact.  HEENT: Head: normal shape and size; Eyes: sclera white, no icterus, conjunctiva pink, PERRLA and EOMs intact; Ears: Tm's gray and intact, normal light reflex;  Throat/Mouth: Teeth present, mucosa pink and moist, no exudate, lesions or ulcerations noted.  Neck:  Neck supple, trachea midline. No masses, lumps or thyromegaly present.  Cardiovascular: Normal rate and rhythm. S1,S2 noted.  No murmur, rubs or gallops noted. No JVD or BLE edema. No carotid bruits noted. Pulmonary/Chest: Normal effort and positive vesicular breath sounds. No respiratory distress. No wheezes, rales or ronchi noted.  Abdomen: Soft and nontender. Normal bowel sounds. No distention or masses noted. Liver, spleen and kidneys non palpable. Musculoskeletal:Strength 5/5 BUE/BLE. No difficulty with gait.  Neurological: Alert and oriented. Cranial nerves II-XII grossly intact. Coordination normal.  Psychiatric: Mood and affect normal. Behavior is normal. Judgment and thought content normal.     BMET    Component Value Date/Time   NA 138 09/07/2016 0847   K 4.6 09/07/2016 0847   CL 105 09/07/2016 0847   CO2 28 09/07/2016 0847   GLUCOSE 96 09/07/2016 0847   BUN 18 09/07/2016 0847   CREATININE 1.05 09/07/2016 0847   CALCIUM 9.5 09/07/2016 0847    Lipid  Panel     Component Value Date/Time   CHOL 244 (H) 12/12/2016 1555   TRIG 151.0 (H) 12/12/2016 1555   HDL 43.80 12/12/2016 1555   CHOLHDL 6 12/12/2016 1555   VLDL 30.2 12/12/2016 1555   LDLCALC 170 (H) 12/12/2016 1555    CBC    Component Value Date/Time   WBC 5.1 09/07/2016 0847   RBC 5.23 09/07/2016 0847   HGB 15.6 09/07/2016 0847   HCT 45.6 09/07/2016 0847   PLT 216.0 09/07/2016 0847   MCV 87.2 09/07/2016 0847   MCHC 34.3 09/07/2016 0847   RDW 13.6 09/07/2016 0847    Hgb A1C Lab Results  Component Value Date   HGBA1C 5.5 09/07/2016            Assessment & Plan:   Preventative Health Maintenance:  Encouraged him to get a flu shot in the fall Tetanus UTD Colon screening UTD Encouraged him to consume a balanced diet and exercise regimen Advised him to see an eye doctor and dentist annually Will check CBC, CMET, Lipid and PSA today  RTC in 1 year, sooner if needed Nicki Reaper, NP

## 2017-09-13 NOTE — Assessment & Plan Note (Addendum)
CMET and Lipid profile today Encouraged him to consume a low fat diet Consider red yeast rice if cholesterol still elevated

## 2017-09-14 LAB — CBC
HEMATOCRIT: 47.4 % (ref 39.0–52.0)
Hemoglobin: 16.3 g/dL (ref 13.0–17.0)
MCHC: 34.4 g/dL (ref 30.0–36.0)
MCV: 86.8 fl (ref 78.0–100.0)
Platelets: 275 10*3/uL (ref 150.0–400.0)
RBC: 5.46 Mil/uL (ref 4.22–5.81)
RDW: 13.6 % (ref 11.5–15.5)
WBC: 7.1 10*3/uL (ref 4.0–10.5)

## 2017-09-14 LAB — COMPREHENSIVE METABOLIC PANEL
ALT: 24 U/L (ref 0–53)
AST: 17 U/L (ref 0–37)
Albumin: 4.9 g/dL (ref 3.5–5.2)
Alkaline Phosphatase: 74 U/L (ref 39–117)
BILIRUBIN TOTAL: 0.7 mg/dL (ref 0.2–1.2)
BUN: 17 mg/dL (ref 6–23)
CALCIUM: 9.9 mg/dL (ref 8.4–10.5)
CHLORIDE: 102 meq/L (ref 96–112)
CO2: 29 mEq/L (ref 19–32)
CREATININE: 0.99 mg/dL (ref 0.40–1.50)
GFR: 84.62 mL/min (ref >60.00)
Glucose, Bld: 90 mg/dL (ref 70–99)
Potassium: 4.6 mEq/L (ref 3.5–5.1)
Sodium: 140 mEq/L (ref 135–145)
TOTAL PROTEIN: 7.2 g/dL (ref 6.0–8.3)

## 2017-09-14 LAB — PSA: PSA: 1.23 ng/mL (ref 0.10–4.00)

## 2017-09-14 LAB — LIPID PANEL
Cholesterol: 243 mg/dL — ABNORMAL HIGH (ref 0–200)
HDL: 43.8 mg/dL (ref >39.00)
LDL CALC: 176 mg/dL — AB (ref 0–99)
NONHDL: 199.51
Total CHOL/HDL Ratio: 6
Triglycerides: 120 mg/dL (ref 0.0–149.0)
VLDL: 24 mg/dL (ref 0.0–40.0)

## 2017-09-19 NOTE — Addendum Note (Signed)
Addended by: Roena MaladyEVONTENNO, Korbin Mapps Y on: 09/19/2017 09:50 AM   Modules accepted: Orders

## 2018-01-24 ENCOUNTER — Ambulatory Visit: Payer: BC Managed Care – PPO | Admitting: Internal Medicine

## 2018-01-24 ENCOUNTER — Encounter: Payer: Self-pay | Admitting: Internal Medicine

## 2018-01-24 VITALS — BP 126/84 | HR 76 | Temp 98.0°F | Wt 187.0 lb

## 2018-01-24 DIAGNOSIS — J00 Acute nasopharyngitis [common cold]: Secondary | ICD-10-CM | POA: Diagnosis not present

## 2018-01-24 NOTE — Patient Instructions (Signed)
Upper Respiratory Infection, Adult Most upper respiratory infections (URIs) are caused by a virus. A URI affects the nose, throat, and upper air passages. The most common type of URI is often called "the common cold." Follow these instructions at home:  Take medicines only as told by your doctor.  Gargle warm saltwater or take cough drops to comfort your throat as told by your doctor.  Use a warm mist humidifier or inhale steam from a shower to increase air moisture. This may make it easier to breathe.  Drink enough fluid to keep your pee (urine) clear or pale yellow.  Eat soups and other clear broths.  Have a healthy diet.  Rest as needed.  Go back to work when your fever is gone or your doctor says it is okay. ? You may need to stay home longer to avoid giving your URI to others. ? You can also wear a face mask and wash your hands often to prevent spread of the virus.  Use your inhaler more if you have asthma.  Do not use any tobacco products, including cigarettes, chewing tobacco, or electronic cigarettes. If you need help quitting, ask your doctor. Contact a doctor if:  You are getting worse, not better.  Your symptoms are not helped by medicine.  You have chills.  You are getting more short of breath.  You have brown or red mucus.  You have yellow or brown discharge from your nose.  You have pain in your face, especially when you bend forward.  You have a fever.  You have puffy (swollen) neck glands.  You have pain while swallowing.  You have white areas in the back of your throat. Get help right away if:  You have very bad or constant: ? Headache. ? Ear pain. ? Pain in your forehead, behind your eyes, and over your cheekbones (sinus pain). ? Chest pain.  You have long-lasting (chronic) lung disease and any of the following: ? Wheezing. ? Long-lasting cough. ? Coughing up blood. ? A change in your usual mucus.  You have a stiff neck.  You have  changes in your: ? Vision. ? Hearing. ? Thinking. ? Mood. This information is not intended to replace advice given to you by your health care provider. Make sure you discuss any questions you have with your health care provider. Document Released: 07/26/2007 Document Revised: 10/10/2015 Document Reviewed: 05/14/2013 Elsevier Interactive Patient Education  2018 [employer]  

## 2018-01-24 NOTE — Progress Notes (Signed)
HPI  Pt presents to the clinic today with c/o headache, nasal congestion, cough and chest congestion. He reports this started about 1- 2 week ago. The headache is located all over. He describes it as a dull ache. He denies dizziness or visual changes. He is blowing green mucous out of his nose. The cough is productive of green mucous. He denies runny nose, nasal congestion, ear pain, sore throat or shortness of breath. He denies fever, chills or body aches. He went to [facility] 12/3 for the same. He was prescribed Augmentin and cough syrup. He has also tried Mucinex with minimal relief. He has no history of allergies or breathing problems. He has not had sick contacts.  Review of Systems      Past Medical History:  Diagnosis Date  . GERD (gastroesophageal reflux disease)   . Hx MRSA infection 2012    Family History  Problem Relation Age of Onset  . Heart disease Father   . Diabetes Paternal Uncle     Social History   Socioeconomic History  . Marital status: Married    Spouse name: Not on file  . Number of children: Not on file  . Years of education: Not on file  . Highest education level: Not on file  Occupational History  . Not on file  Social Needs  . Financial resource strain: Not on file  . Food insecurity:    Worry: Not on file    Inability: Not on file  . Transportation needs:    Medical: Not on file    Non-medical: Not on file  Tobacco Use  . Smoking status: Former Smoker    Packs/day: 1.00    Years: 10.00    Pack years: 10.00    Types: Cigarettes, E-cigarettes    Last attempt to quit: 09/09/1985    Years since quitting: 32.3  . Smokeless tobacco: Former NeurosurgeonUser    Types: Chew    Quit date: 08/11/2015  . Tobacco comment: Last used e Cigarette 09/15/15  Substance and Sexual Activity  . Alcohol use: Yes    Comment: occasional  . Drug use: No  . Sexual activity: Yes  Lifestyle  . Physical activity:    Days per week: Not on file    Minutes per session: Not on  file  . Stress: Not on file  Relationships  . Social connections:    Talks on phone: Not on file    Gets together: Not on file    Attends religious service: Not on file    Active member of club or organization: Not on file    Attends meetings of clubs or organizations: Not on file    Relationship status: Not on file  . Intimate partner violence:    Fear of current or ex partner: Not on file    Emotionally abused: Not on file    Physically abused: Not on file    Forced sexual activity: Not on file  Other Topics Concern  . Not on file  Social History Narrative  . Not on file    No Known Allergies   Constitutional: Positive headache. Denies fatigue, fever or abrupt weight changes.  HEENT: Pt reports nasal congestion. Denies eye redness, eye pain, pressure behind the eyes, facial pain, ear pain, ringing in the ears, wax buildup, runny nose or sore throat. Respiratory: Positive cough. Denies difficulty breathing or shortness of breath.  Cardiovascular: Denies chest pain, chest tightness, palpitations or swelling in the hands or feet.  No other specific complaints in a complete review of systems (except as listed in HPI above).  Objective:   BP 126/84   Pulse 76   Temp 98 F (36.7 C) (Oral)   Wt 187 lb (84.8 kg)   SpO2 98%   BMI 28.02 kg/m   Wt Readings from Last 3 Encounters:  01/24/18 187 lb (84.8 kg)  09/13/17 186 lb (84.4 kg)  12/22/16 196 lb 8 oz (89.1 kg)     General: Appears his stated age, in NAD. HEENT: Head: normal shape and size, no sinus tenderness noted; ars: Tm's gray and intact, normal light reflex; Nose: mucosa pink and moist, septum midline; Throat/Mouth: + PND. Teeth present, mucosa erythematous and moist, no exudate noted, no lesions or ulcerations noted.  Neck: No cervical lymphadenopathy.  Cardiovascular: Normal rate and rhythm. S1,S2 noted.  No murmur, rubs or gallops noted.  Pulmonary/Chest: Normal effort and positive vesicular breath sounds. No  respiratory distress. No wheezes, rales or ronchi noted.       Assessment & Plan:   Upper Respiratory Infection:  Get some rest and drink plenty of water Continue Augmentin and cough syrup prescribed by UC 80 mg Depo IM today Can take Ibuprofen or Tylenol for headache Flonase OTC may be helpful  RTC as needed or if symptoms persist.   Nicki Reaper, NP

## 2018-04-12 ENCOUNTER — Other Ambulatory Visit: Payer: Self-pay

## 2018-04-12 ENCOUNTER — Encounter: Payer: Self-pay | Admitting: Emergency Medicine

## 2018-04-12 ENCOUNTER — Emergency Department
Admission: EM | Admit: 2018-04-12 | Discharge: 2018-04-12 | Disposition: A | Discharge status: Home / Self Care | Payer: BC Managed Care – PPO | Attending: Emergency Medicine | Admitting: Emergency Medicine

## 2018-04-12 DIAGNOSIS — M5441 Lumbago with sciatica, right side: Secondary | ICD-10-CM | POA: Diagnosis not present

## 2018-04-12 DIAGNOSIS — Z87891 Personal history of nicotine dependence: Secondary | ICD-10-CM | POA: Insufficient documentation

## 2018-04-12 DIAGNOSIS — Z79899 Other long term (current) drug therapy: Secondary | ICD-10-CM | POA: Diagnosis not present

## 2018-04-12 DIAGNOSIS — M545 Low back pain: Secondary | ICD-10-CM | POA: Diagnosis present

## 2018-04-12 HISTORY — DX: Radiculopathy, lumbar region: M54.16

## 2018-04-12 MED ORDER — OXYCODONE-ACETAMINOPHEN 5-325 MG PO TABS: 2.0000 | ORAL_TABLET | Freq: Four times a day (QID) | ORAL | 0 refills | Status: DC | PRN

## 2018-04-12 MED ORDER — PREDNISONE 10 MG PO TABS: ORAL_TABLET | ORAL | 0 refills | Status: DC

## 2018-04-12 MED ORDER — LIDOCAINE 5 % EX PTCH: 1.0000 | MEDICATED_PATCH | Freq: Two times a day (BID) | CUTANEOUS | 0 refills | Status: DC

## 2018-04-12 MED ORDER — DOCUSATE SODIUM 100 MG PO CAPS: ORAL_CAPSULE | ORAL | 0 refills | Status: DC

## 2018-04-12 MED ORDER — HYDROMORPHONE HCL 1 MG/ML IJ SOLN
1.0000 mg | INTRAMUSCULAR | Status: AC
  Administered 2018-04-12: 1 mg via INTRAMUSCULAR
  Filled 2018-04-12: qty 1

## 2018-04-12 MED ORDER — PREDNISONE 20 MG PO TABS
60.0000 mg | ORAL_TABLET | ORAL | Status: AC
  Administered 2018-04-12: 60 mg via ORAL
  Filled 2018-04-12: qty 3

## 2018-04-12 MED ORDER — HYDROCODONE-ACETAMINOPHEN 5-325 MG PO TABS
2.0000 | ORAL_TABLET | Freq: Once | ORAL | Status: AC
  Administered 2018-04-12: 2 via ORAL
  Filled 2018-04-12: qty 2

## 2018-04-12 MED ORDER — METHYLPREDNISOLONE ACETATE 80 MG/ML IJ SUSP
80.0000 mg | Freq: Once | INTRAMUSCULAR | Status: AC
  Administered 2018-01-24: 80 mg via INTRAMUSCULAR

## 2018-04-12 MED ORDER — CYCLOBENZAPRINE HCL 5 MG PO TABS: 5.0000 mg | ORAL_TABLET | Freq: Three times a day (TID) | ORAL | 0 refills | Status: DC | PRN

## 2018-04-12 NOTE — ED Notes (Signed)
Reviewed discharge instructions, follow-up care, and prescriptions with patient. Patient verbalized understanding of all information reviewed. Patient stable, with no distress noted at this time.    

## 2018-04-12 NOTE — Discharge Instructions (Signed)
You have been seen in the Emergency Department (ED)  today for back pain.  Your workup and exam have not shown any acute abnormalities and you are likely suffering from muscle strain or possible problems with your discs, but there is no treatment that will fix your symptoms at this time.  Please take the diclofenac you have been previously prescribed along with the new medications today.   Take Percocet as prescribed for severe pain. Do not drink alcohol, drive or participate in any other potentially dangerous activities while taking this medication as it may make you sleepy. Do not take this medication with any other sedating medications, either prescription or over-the-counter. If you were prescribed Percocet or Vicodin, do not take these with acetaminophen (Tylenol) as it is already contained within these medications.   This medication is an opiate (or narcotic) pain medication and can be habit forming.  Use it as little as possible to achieve adequate pain control.  Do not use or use it with extreme caution if you have a history of opiate abuse or dependence.  If you are on a pain contract with your primary care doctor or a pain specialist, be sure to let them know you were prescribed this medication today from the [facility] Emergency Department.  This medication is intended for your use only - do not give any to anyone else and keep it in a secure place where nobody else, especially children, have access to it.  It will also cause or worsen constipation, so you may want to consider taking an over-the-counter stool softener while you are taking this medication.  Please follow up with your doctor as soon as possible regarding today's ED visit and your back pain.  Return to the ED for worsening back pain, fever, weakness or numbness of either leg, or if you develop either (1) an inability to urinate or have bowel movements, or (2) loss of your ability to control your bathroom functions (if you start  having "accidents"), or if you develop other new symptoms that concern you.

## 2018-04-12 NOTE — ED Notes (Signed)
Patient c/o lower right back pain radiating down right leg. Patient reports fall on deck Saturday, and tried to lift heavy objects Monday. Patient reports pain has steadily increased since Monday. Patient took 800mg  ibuprofen at 0300. Patient took diclofenac at 1700 yesterday. Patient denies relief of symptoms.

## 2018-04-12 NOTE — ED Notes (Signed)
MD Forbach at bedside. 

## 2018-04-12 NOTE — ED Provider Notes (Signed)
[facility] Emergency Department Provider Note  ____________________________________________   First MD Initiated Contact with Patient 04/12/18 0451     (approximate)  I have reviewed the triage vital signs and the nursing notes.   HISTORY  Chief Complaint Back Pain    HPI Russell Diaz is a 53 y.o. male with history of lower back problems with multiple bulging disks and sciatica radiating down his right leg.  He presents by private vehicle for evaluation of gradually worsening pain in his right lower back radiating down the right leg for the last 4 days.  He reports that about 5 or 6 days ago he had a fall where he just slipped on some wet pavement.  That aggravated the injury and then he hurt it again 4 days ago when he was doing some work on his tractor.  Usually the diclofenac he takes at home alleviates the pain most of the time, but it is not been working.  The pain is worse with moving and with ambulation.  He cannot find a position of comfort tonight and cannot sleep.  He is not having any trouble urinating or having bowel movements and has not had any incontinence of urine or stool.  He says it feels the same as his usual pain but has been worse.  He denies fever/chills, chest pain or shortness of breath, nausea, vomiting, and abdominal pain.  He says that he has been able to avoid taking any narcotics for the pain in the past because he does not want to take them but the pain is quite severe at this time.  It is mostly sharp but also aching and occasionally feels like spasms.  Past Medical History:  Diagnosis Date  . GERD (gastroesophageal reflux disease)   . Hx MRSA infection 2012  . Lumbar back pain with radiculopathy affecting right lower extremity    hx of multiple bulging discs with sciatica, sees Dr. Newell Coral [facility] neuro[facility])    Patient Active Problem List   Diagnosis Date Noted  . Pure hypercholesterolemia 09/13/2017  . Chronic back  pain 09/13/2017  . Gastroesophageal reflux disease 07/14/2016    Past Surgical History:  Procedure Laterality Date  . CYSTOSCOPY W/ SPINCTEROTOMY    . INGUINAL HERNIA REPAIR Bilateral 09/16/2015   Procedure: HERNIA REPAIR INGUINAL ADULT BILATERAL;  Surgeon: Nadeen Landau, MD;  Location: [facility] ORS;  Service: General;  Laterality: Bilateral;  . LASIK Bilateral     Prior to Admission medications   Medication Sig Start Date End Date Taking? Authorizing Provider  amoxicillin-clavulanate (AUGMENTIN) 875-125 MG tablet Take 1 tablet by mouth 2 (two) times daily.  01/22/18   [provider]  brompheniramine-pseudoephedrine-DM 30-2-10 MG/5ML syrup  01/22/18   [provider]  cyclobenzaprine (FLEXERIL) 5 MG tablet Take 1 tablet (5 mg total) by mouth 3 (three) times daily as needed for muscle spasms. 04/12/18   Loleta Rose, MD  diclofenac (VOLTAREN) 75 MG EC tablet Take 75 mg by mouth 2 (two) times daily. 08/15/17   [provider]  docusate sodium (COLACE) 100 MG capsule Take 1 tablet once or twice daily as needed for constipation while taking narcotic pain medicine 04/12/18   Loleta Rose, MD  oxyCODONE-acetaminophen (PERCOCET) 5-325 MG tablet Take 2 tablets by mouth every 6 (six) hours as needed for severe pain. 04/12/18   Loleta Rose, MD  predniSONE (DELTASONE) 10 MG tablet Take 6 tabs (60 mg) PO x 3 days, then take 4 tabs (40 mg) PO  x 3 days, then take 2 tabs (20 mg) PO x 3 days, then take 1 tab (10 mg) PO x 3 days, then take 1/2 tab (5 mg) PO x 4 days. 04/12/18   Loleta RoseForbach, Erma Joubert, MD    Allergies Patient has no known allergies.  Family History  Problem Relation Age of Onset  . Heart disease Father   . Diabetes Paternal Uncle     Social History Social History   Tobacco Use  . Smoking status: Former Smoker    Packs/day: 1.00    Years: 10.00    Pack years: 10.00    Types: Cigarettes, E-cigarettes    Last attempt to quit: 09/09/1985    Years since quitting:  32.6  . Smokeless tobacco: Former NeurosurgeonUser    Types: Chew    Quit date: 08/11/2015  . Tobacco comment: Last used e Cigarette 09/15/15  Substance Use Topics  . Alcohol use: Yes    Comment: occasional  . Drug use: No    Review of Systems Constitutional: No fever/chills Cardiovascular: Denies chest pain. Respiratory: Denies shortness of breath. Gastrointestinal: No abdominal pain.  No nausea, no vomiting.  No diarrhea.  No constipation. Genitourinary: Negative for dysuria.  No incontinence of urine or bowel.  No urinary retention. Musculoskeletal: Low back pain radiating down the right leg as described above. Integumentary: Negative for rash. Neurological: Negative for headaches, focal weakness or numbness.   ____________________________________________   PHYSICAL EXAM:  VITAL SIGNS: ED Triage Vitals  Enc Vitals Group     BP 04/12/18 0316 (!) 180/110     Pulse Rate 04/12/18 0316 67     Resp 04/12/18 0316 18     Temp 04/12/18 0316 97.8 F (36.6 C)     Temp Source 04/12/18 0316 Oral     SpO2 04/12/18 0316 96 %     Weight 04/12/18 0313 86.2 kg (190 lb)     Height 04/12/18 0313 1.778 m (5\' 10" )     Head Circumference --      Peak Flow --      Pain Score 04/12/18 0313 10     Pain Loc --      Pain Edu? --      Excl. in GC? --     Constitutional: Alert and oriented.  Not in severe distress but does appear very uncomfortable and cannot seem to find a position of comfort. Eyes: Conjunctivae are normal.  Head: Atraumatic. Cardiovascular: Normal rate, regular rhythm. Good peripheral circulation. Grossly normal heart sounds. Respiratory: Normal respiratory effort.  No retractions. Lungs CTAB. Gastrointestinal: Soft and nontender. No distention.  Musculoskeletal: No lower extremity tenderness nor edema. No gross deformities of extremities.  The patient has a healing bruise on the right side of his lower back consistent with his report of a recent fall.  He has no tenderness to  palpation of the spine itself and no step-offs or deformities.  He has easily reproducible sciatica with extension of his leg and when he ambulates.  He is able to ambulate without difficulty but it appears to be uncomfortable to do so. Neurologic:  Normal speech and language. No gross focal neurologic deficits are appreciated.  Skin:  Skin is warm, dry and intact. No rash noted. Psychiatric: Mood and affect are normal. Speech and behavior are normal.  ____________________________________________   LABS (all labs ordered are listed, but only abnormal results are displayed)  Labs Reviewed - No data to display ____________________________________________  EKG  No indication for EKG ____________________________________________  RADIOLOGY   ED MD interpretation: No indication for imaging  Official radiology report(s): No results found.  ____________________________________________   PROCEDURES  Critical Care performed: No   Procedure(s) performed:   Procedures   ____________________________________________   INITIAL IMPRESSION / ASSESSMENT AND PLAN / ED COURSE  As part of my medical decision making, I reviewed the following data within the electronic MEDICAL RECORD NUMBER Nursing notes reviewed and incorporated, Notes from prior ED visits and [place] Controlled Substance Database    No warning signs or red flags for emergent causes of back pain such as cauda equina syndrome or epidural abscess.  The patient is afebrile without tachycardia and does have hypertension but I suspect this is both chronic and related to his acute pain.  The signs and symptoms are very consistent with chronic bulging disks that have been aggravated by his recent injuries and are now causing severe sciatica.  He has no warning signs in the controlled substance database.  I am giving him Dilaudid 1 mg intramuscular and 2 Norco by mouth as well as 60 mg of prednisone.  I will put him on a prednisone taper and  prescribed Flexeril, short course of Percocet, and also give him some Colace for the possible constipation.  I encouraged him to continue taking his diclofenac because it typically works.  He sees Dr. Newell Coral with [facility] and I encouraged him to continue following up at that clinic.  Because he lives in [place] and has had trouble getting an appointment in the clinic, I did give him the number of Dr. Myer Haff, [facility] neuro[facility] who sees patients at [facility], but I strongly encouraged him to see has existing neurosurgeon and to only contact Dr. Myer Haff if he intends to change his care.  He understands and agrees with the plan and he also understands the strict return precautions associated with low back pain.     ____________________________________________  FINAL CLINICAL IMPRESSION(S) / ED DIAGNOSES  Final diagnoses:  Acute right-sided low back pain with right-sided sciatica     MEDICATIONS GIVEN DURING THIS VISIT:  Medications  predniSONE (DELTASONE) tablet 60 mg (has no administration in time range)  HYDROmorphone (DILAUDID) injection 1 mg (has no administration in time range)  HYDROcodone-acetaminophen (NORCO/VICODIN) 5-325 MG per tablet 2 tablet (has no administration in time range)     ED Discharge Orders         Ordered    oxyCODONE-acetaminophen (PERCOCET) 5-325 MG tablet  Every 6 hours PRN     04/12/18 0550    predniSONE (DELTASONE) 10 MG tablet     04/12/18 0550    cyclobenzaprine (FLEXERIL) 5 MG tablet  3 times daily PRN     04/12/18 0550    docusate sodium (COLACE) 100 MG capsule     04/12/18 0550           Note:  This document was prepared using Dragon voice recognition software and may include unintentional dictation errors.   Loleta Rose, MD 04/12/18 [phone]

## 2018-04-12 NOTE — ED Triage Notes (Signed)
Patient ambulatory to triage with steady gait, without difficulty or distress noted; pt reports fell on slick deck Saturday then again injured back on Monday while working on tractor; c/o lower back pain radiating down right leg; st hx of same

## 2018-04-12 NOTE — Addendum Note (Signed)
Addended by: Roena Malady on: 04/12/2018 12:36 PM   Modules accepted: Orders

## 2018-04-15 ENCOUNTER — Encounter: Payer: Self-pay | Admitting: Internal Medicine

## 2018-04-15 ENCOUNTER — Ambulatory Visit: Payer: BC Managed Care – PPO | Admitting: Internal Medicine

## 2018-04-15 VITALS — BP 148/90 | HR 66 | Temp 97.9°F | Resp 16 | Wt 197.0 lb

## 2018-04-15 DIAGNOSIS — G8929 Other chronic pain: Secondary | ICD-10-CM

## 2018-04-15 DIAGNOSIS — M5441 Lumbago with sciatica, right side: Secondary | ICD-10-CM

## 2018-04-15 MED ORDER — OXYCODONE-ACETAMINOPHEN 5-325 MG PO TABS: 2.0000 | ORAL_TABLET | Freq: Four times a day (QID) | ORAL | 0 refills | Status: DC | PRN

## 2018-04-15 NOTE — Progress Notes (Signed)
Subjective:    Patient ID: Russell Diaz, male    DOB: [birth year], 53 y.o.   MRN: [id number]  HPI  Pt presents to the clinic today for ER follow up. He went to the ER 2/21 with c/o low back pain that radiated into the left leg. He had had a fall about 4 days earlier. He has a history of chronic low back pain with right sided sciatica. He follows with [facility] in [place]. They did not obtain xray or MRI. He was given Dilaudid IM and discharged with Prednisone, Flexeril and Percocet. He was advised to follow up with neurosurgery and has an appt with Gavin Potters tomorrow. Since discharge, he reports no improvement in the pain. He is out of Percocet and requesting a refill of that today.  Review of Systems      Past Medical History:  Diagnosis Date  . GERD (gastroesophageal reflux disease)   . Hx MRSA infection 2012  . Lumbar back pain with radiculopathy affecting right lower extremity    hx of multiple bulging discs with sciatica, sees Dr. Newell Coral [facility] neurosurgery)    Current Outpatient Medications  Medication Sig Dispense Refill  . amoxicillin-clavulanate (AUGMENTIN) 875-125 MG tablet Take 1 tablet by mouth 2 (two) times daily.     . brompheniramine-pseudoephedrine-DM 30-2-10 MG/5ML syrup     . cyclobenzaprine (FLEXERIL) 5 MG tablet Take 1 tablet (5 mg total) by mouth 3 (three) times daily as needed for muscle spasms. 30 tablet 0  . diclofenac (VOLTAREN) 75 MG EC tablet Take 75 mg by mouth 2 (two) times daily.  3  . docusate sodium (COLACE) 100 MG capsule Take 1 tablet once or twice daily as needed for constipation while taking narcotic pain medicine 30 capsule 0  . lidocaine (LIDODERM) 5 % Place 1 patch onto the skin every 12 (twelve) hours. Remove & Discard patch within 12 hours or as directed by MD.  Wynelle Fanny the patch off for 12 hours before applying a new one. 10 patch 0  . oxyCODONE-acetaminophen (PERCOCET) 5-325 MG tablet Take 2 tablets by mouth every 6 (six) hours as  needed for severe pain. 16 tablet 0  . predniSONE (DELTASONE) 10 MG tablet Take 6 tabs (60 mg) PO x 3 days, then take 4 tabs (40 mg) PO x 3 days, then take 2 tabs (20 mg) PO x 3 days, then take 1 tab (10 mg) PO x 3 days, then take 1/2 tab (5 mg) PO x 4 days. 41 tablet 0   No current facility-administered medications for this visit.     No Known Allergies  Family History  Problem Relation Age of Onset  . Heart disease Father   . Diabetes Paternal Uncle     Social History   Socioeconomic History  . Marital status: Married    Spouse name: Not on file  . Number of children: Not on file  . Years of education: Not on file  . Highest education level: Not on file  Occupational History  . Not on file  Social Needs  . Financial resource strain: Not on file  . Food insecurity:    Worry: Not on file    Inability: Not on file  . Transportation needs:    Medical: Not on file    Non-medical: Not on file  Tobacco Use  . Smoking status: Former Smoker    Packs/day: 1.00    Years: 10.00    Pack years: 10.00    Types: Cigarettes, E-cigarettes  Last attempt to quit: 09/09/1985    Years since quitting: 32.6  . Smokeless tobacco: Former Neurosurgeon    Types: Chew    Quit date: 08/11/2015  . Tobacco comment: Last used e Cigarette 09/15/15  Substance and Sexual Activity  . Alcohol use: Yes    Comment: occasional  . Drug use: No  . Sexual activity: Yes  Lifestyle  . Physical activity:    Days per week: Not on file    Minutes per session: Not on file  . Stress: Not on file  Relationships  . Social connections:    Talks on phone: Not on file    Gets together: Not on file    Attends religious service: Not on file    Active member of club or organization: Not on file    Attends meetings of clubs or organizations: Not on file    Relationship status: Not on file  . Intimate partner violence:    Fear of current or ex partner: Not on file    Emotionally abused: Not on file    Physically  abused: Not on file    Forced sexual activity: Not on file  Other Topics Concern  . Not on file  Social History Narrative  . Not on file     Constitutional: Denies fever, malaise, fatigue, headache or abrupt weight changes.  Respiratory: Denies difficulty breathing, shortness of breath, cough or sputum production.   Cardiovascular: Denies chest pain, chest tightness, palpitations or swelling in the hands or feet.  Gastrointestinal: Denies abdominal pain, bloating, constipation, diarrhea or blood in the stool.  GU: Denies urgency, frequency, pain with urination, burning sensation, blood in urine, odor or discharge. Musculoskeletal: Pt reports low back and right leg pain and weakness. Denies difficulty with gait, or joint  swelling.  Neurological: Pt reports numbness and tingling in right leg. Denies dizziness, difficulty with memory, difficulty with speech or problems with balance and coordination.   No other specific complaints in a complete review of systems (except as listed in HPI above).  Objective:   Physical Exam   BP (!) 148/90   Pulse 66   Temp 97.9 F (36.6 C) (Oral)   Resp 16   Wt 197 lb (89.4 kg)   SpO2 96%   BMI 28.27 kg/m  Wt Readings from Last 3 Encounters:  04/15/18 197 lb (89.4 kg)  04/12/18 190 lb (86.2 kg)  01/24/18 187 lb (84.8 kg)    General: Appears his stated age, well developed, well nourished in NAD. SCardiovascular: Normal rate and rhythm.  Pulmonary/Chest: Normal effort and positive vesicular breath sounds. No respiratory distress. No wheezes, rales or ronchi noted.  Musculoskeletal: Decreased flexion and extension of the lumbar spine. Normal rotation. Bony tenderness noted over the lumbar spine. Strength 4/5 RLE, 5/5 LLE. No difficulty with gait.  Neurological: Alert and oriented. Positive SLR on the right.  BMET    Component Value Date/Time   NA 140 09/13/2017 1552   K 4.6 09/13/2017 1552   CL 102 09/13/2017 1552   CO2 29 09/13/2017 1552     GLUCOSE 90 09/13/2017 1552   BUN 17 09/13/2017 1552   CREATININE 0.99 09/13/2017 1552   CALCIUM 9.9 09/13/2017 1552    Lipid Panel     Component Value Date/Time   CHOL 243 (H) 09/13/2017 1552   TRIG 120.0 09/13/2017 1552   HDL 43.80 09/13/2017 1552   CHOLHDL 6 09/13/2017 1552   VLDL 24.0 09/13/2017 1552   LDLCALC 176 (H) 09/13/2017  1552    CBC    Component Value Date/Time   WBC 7.1 09/13/2017 1552   RBC 5.46 09/13/2017 1552   HGB 16.3 09/13/2017 1552   HCT 47.4 09/13/2017 1552   PLT 275.0 09/13/2017 1552   MCV 86.8 09/13/2017 1552   MCHC 34.4 09/13/2017 1552   RDW 13.6 09/13/2017 1552    Hgb A1C Lab Results  Component Value Date   HGBA1C 5.5 09/07/2016           Assessment & Plan:   Chronic Low Back Pain with Right Sided Sciatica:  ER notes reviewed Continue Prednisone, Flexeril and Percocet Percocet refilled today Stretching exercises given Resume Diclofenac once you are done with Prednisone Follow up with neurosurgery as planned  Return precautions discussed Nicki Reaper, NP

## 2018-04-15 NOTE — Patient Instructions (Signed)
Sciatica    Sciatica is pain, numbness, weakness, or tingling along your sciatic nerve. The sciatic nerve starts in the lower back and goes down the back of each leg. Sciatica happens when this nerve is pinched or has pressure put on it. Sciatica usually goes away on its own or with treatment. Sometimes, sciatica may keep coming back (recur).  Follow these instructions at home:  Medicines  · Take over-the-counter and prescription medicines only as told by your doctor.  · Do not drive or use heavy machinery while taking prescription pain medicine.  Managing pain  · If directed, put ice on the affected area.  ? Put ice in a plastic bag.  ? Place a towel between your skin and the bag.  ? Leave the ice on for 20 minutes, 2-3 times a day.  · After icing, apply heat to the affected area before you exercise or as often as told by your doctor. Use the heat source that your doctor tells you to use, such as a moist heat pack or a heating pad.  ? Place a towel between your skin and the heat source.  ? Leave the heat on for 20-30 minutes.  ? Remove the heat if your skin turns bright red. This is especially important if you are unable to feel pain, heat, or cold. You may have a greater risk of getting burned.  Activity  · Return to your normal activities as told by your doctor. Ask your doctor what activities are safe for you.  ? Avoid activities that make your sciatica worse.  · Take short rests during the day. Rest in a lying or standing position. This is usually better than sitting to rest.  ? When you rest for a long time, do some physical activity or stretching between periods of rest.  ? Avoid sitting for a long time without moving. Get up and move around at least one time each hour.  · Exercise and stretch regularly, as told by your doctor.  · Do not lift anything that is heavier than 10 lb (4.5 kg) while you have symptoms of sciatica.  ? Avoid lifting heavy things even when you do not have symptoms.  ? Avoid lifting  heavy things over and over.  · When you lift objects, always lift in a way that is safe for your body. To do this, you should:  ? Bend your knees.  ? Keep the object close to your body.  ? Avoid twisting.  General instructions  · Use good posture.  ? Avoid leaning forward when you are sitting.  ? Avoid hunching over when you are standing.  · Stay at a healthy weight.  · Wear comfortable shoes that support your feet. Avoid wearing high heels.  · Avoid sleeping on a mattress that is too soft or too hard. You might have less pain if you sleep on a mattress that is firm enough to support your back.  · Keep all follow-up visits as told by your doctor. This is important.  Contact a doctor if:  · You have pain that:  ? Wakes you up when you are sleeping.  ? Gets worse when you lie down.  ? Is worse than the pain you have had in the past.  ? Lasts longer than 4 weeks.  · You lose weight for without trying.  Get help right away if:  · You cannot control when you pee (urinate) or poop (have a bowel movement).  · You   have weakness in any of these areas and it gets worse.  ? Lower back.  ? Lower belly (pelvis).  ? Butt (buttocks).  ? Legs.  · You have redness or swelling of your back.  · You have a burning feeling when you pee.  This information is not intended to replace advice given to you by your health care provider. Make sure you discuss any questions you have with your health care provider.  Document Released: 11/16/2007 Document Revised: 07/15/2015 Document Reviewed: 10/16/2014  Elsevier Interactive Patient Education © 2019 [employer]

## 2018-04-17 ENCOUNTER — Other Ambulatory Visit: Payer: Self-pay | Admitting: Student

## 2018-04-17 ENCOUNTER — Other Ambulatory Visit (HOSPITAL_COMMUNITY): Payer: Self-pay | Admitting: Student

## 2018-04-17 DIAGNOSIS — M5416 Radiculopathy, lumbar region: Secondary | ICD-10-CM

## 2018-04-27 ENCOUNTER — Other Ambulatory Visit: Payer: Self-pay

## 2018-04-27 ENCOUNTER — Ambulatory Visit
Admission: RE | Admit: 2018-04-27 | Discharge: 2018-04-27 | Disposition: A | Discharge status: Home / Self Care | Payer: BC Managed Care – PPO | Source: Ambulatory Visit | Attending: Student | Admitting: Student

## 2018-04-27 DIAGNOSIS — M5416 Radiculopathy, lumbar region: Secondary | ICD-10-CM | POA: Diagnosis present

## 2018-04-29 ENCOUNTER — Ambulatory Visit: Payer: BC Managed Care – PPO

## 2018-10-08 ENCOUNTER — Encounter: Payer: Self-pay | Admitting: Family Medicine

## 2018-10-08 ENCOUNTER — Ambulatory Visit (INDEPENDENT_AMBULATORY_CARE_PROVIDER_SITE_OTHER): Payer: BC Managed Care – PPO | Admitting: Family Medicine

## 2018-10-08 DIAGNOSIS — R05 Cough: Secondary | ICD-10-CM | POA: Diagnosis not present

## 2018-10-08 DIAGNOSIS — R059 Cough, unspecified: Secondary | ICD-10-CM

## 2018-10-08 MED ORDER — GUAIFENESIN-CODEINE 100-10 MG/5ML PO SYRP: 5.0000 mL | ORAL_SOLUTION | Freq: Every evening | ORAL | 0 refills | Status: DC | PRN

## 2018-10-08 NOTE — Progress Notes (Signed)
Russell Diaz added to Respiratory Illness Call Log.

## 2018-10-08 NOTE — Assessment & Plan Note (Signed)
Given started more productive and now dry without other symptoms.. most likely post viral bronchospasm vs allergies... treat with mucinex, cough suppressant and antihistamine.  If feeling worse and not better.. will call for possible antibiotics/need for assessment. Not providing antibiotic now given clearing of mucus, no fever/SOB and no other symptoms than cough.  If not improving in 1-2 weeks.. recommend in person eval for lung exam at urgent care.

## 2018-10-08 NOTE — Patient Instructions (Addendum)
Follow BP at home// call if persistentlyl > 140/90.  Start zyrtec at bedtime.  During the day use plain mucinex  DM and at night can use cough syrup. If not improving in 1-2 weeks call for determination of need for antibiotics vs in office assessment at urgent care. If shortness of breath .. go to ER.

## 2018-10-08 NOTE — Progress Notes (Signed)
VIRTUAL VISIT Due to national recommendations of social distancing due to [place] 19, a virtual visit is felt to be most appropriate for this patient at this time.   I connected with the patient on 10/08/18 at  4:20 PM EDT by virtual telehealth platform and verified that I am speaking with the correct person using two identifiers.   I discussed the limitations, risks, security and privacy concerns of performing an evaluation and management service by  virtual telehealth platform and the availability of in person appointments. I also discussed with the patient that there may be a patient responsible charge related to this service. The patient expressed understanding and agreed to proceed.  Patient location: Home Provider Location: [place] [facility] Participants: Eliezer Lofts and Karma Lew   Chief Complaint  Patient presents with  . Cough    x 1 month    History of Present Illness: Cough This is a new problem. The current episode started 1 to 4 weeks ago. Cough characteristics: initially coughing up dark mucus, now only occ and always clear. Pertinent negatives include no chills, ear congestion, ear pain, fever, nasal congestion, postnasal drip, sore throat, shortness of breath, sweats, weight loss or wheezing. Associated symptoms comments: Tickle in chest and throat..    Remote smoking history > 20 years ago.  no history of asthma and COPD  COVID 19 screen No recent travel or known exposure to [place]   The importance of social distancing was discussed today.   Review of Systems  Constitutional: Negative for chills, fever and weight loss.  HENT: Negative for ear pain, postnasal drip and sore throat.   Respiratory: Positive for cough. Negative for shortness of breath and wheezing.       Past Medical History:  Diagnosis Date  . GERD (gastroesophageal reflux disease)   . Hx MRSA infection 2012  . Lumbar back pain with radiculopathy affecting right lower extremity    hx of  multiple bulging discs with sciatica, sees Dr. Sherwood Gambler [facility] neurosurgery)    reports that he quit smoking about 33 years ago. His smoking use included cigarettes and e-cigarettes. He has a 10.00 pack-year smoking history. He quit smokeless tobacco use about 3 years ago.  His smokeless tobacco use included chew. He reports current alcohol use. He reports that he does not use drugs.   Current Outpatient Medications:  .  diclofenac (VOLTAREN) 75 MG EC tablet, Take 75 mg by mouth 2 (two) times daily., Disp: , Rfl: 3   Observations/Objective: Blood pressure (!) 141/102, pulse 79, temperature (!) 96.5 F (35.8 C), temperature source Oral, height 5' 8.5" (1.74 m).    He did take mucinex DM BP Readings from Last 3 Encounters:  10/08/18 (!) 141/102  04/15/18 (!) 148/90  04/12/18 (!) 167/110     Physical Exam  Physical Exam Constitutional:      General: The patient is not in acute distress. Talking in complete sentences Pulmonary:     Effort: Pulmonary effort is normal. No respiratory distress.  Neurological:     Mental Status: The patient is alert and oriented to person, place, and time.  Psychiatric:        Mood and Affect: Mood normal.        Behavior: Behavior normal.   Assessment and Plan Cough  Given started more productive and now dry without other symptoms.. most likely post viral bronchospasm vs allergies... treat with mucinex, cough suppressant and antihistamine.  If feeling worse and not better.. will call for possible  antibiotics/need for assessment. Not providing antibiotic now given clearing of mucus, no fever/SOB and no other symptoms than cough.  If not improving in 1-2 weeks.. recommend in person eval for lung exam at urgent care.     I discussed the assessment and treatment plan with the patient. The patient was provided an opportunity to ask questions and all were answered. The patient agreed with the plan and demonstrated an understanding of the  instructions.   The patient was advised to call back or seek an in-person evaluation if the symptoms worsen or if the condition fails to improve as anticipated.     Kerby NoraAmy Peri Kreft, MD

## 2019-01-08 ENCOUNTER — Ambulatory Visit (INDEPENDENT_AMBULATORY_CARE_PROVIDER_SITE_OTHER): Payer: BC Managed Care – PPO | Admitting: Internal Medicine

## 2019-01-08 ENCOUNTER — Encounter: Payer: Self-pay | Admitting: Internal Medicine

## 2019-01-08 DIAGNOSIS — R05 Cough: Secondary | ICD-10-CM

## 2019-01-08 DIAGNOSIS — H938X3 Other specified disorders of ear, bilateral: Secondary | ICD-10-CM | POA: Diagnosis not present

## 2019-01-08 DIAGNOSIS — R059 Cough, unspecified: Secondary | ICD-10-CM

## 2019-01-08 NOTE — Progress Notes (Signed)
Virtual Visit via Video Note  I connected with Russell Diaz on 01/08/19 at  2:00 PM EST by a video enabled telemedicine application and verified that I am speaking with the correct person using two identifiers.  Location: Patient: Russell Diaz Provider: Mila Merry   I discussed the limitations of evaluation and management by telemedicine and the availability of in person appointments. The patient expressed understanding and agreed to proceed.  History of Present Illness:  Pt reports productive cough and bilateral ear fullness. This started 2-3 weeks ago. The cough was initally productive of clear mucus but has since resolved. He mostly complains of bilateral ear fullness and itchiness. He denies loss of hearing and mentions he had fluid in bilateral ears a few weeks ago. Marland Kitchen He also has slight pain radiating from ears down sides of neck. He denies drainage from ears or rashes. He also denies sick contacts, sore throat, fevers, nausea or vomiting. He reports taking an antibiotic (Amoxicillin) which helped with cough  Past Medical History:  Diagnosis Date  . GERD (gastroesophageal reflux disease)   . Hx MRSA infection 2012  . Lumbar back pain with radiculopathy affecting right lower extremity    hx of multiple bulging discs with sciatica, sees Dr. Newell Coral [facility] neurosurgery)    Current Outpatient Medications  Medication Sig Dispense Refill  . diclofenac (VOLTAREN) 75 MG EC tablet Take 75 mg by mouth 2 (two) times daily.  3  . guaiFENesin-codeine (ROBITUSSIN AC) 100-10 MG/5ML syrup Take 5-10 mLs by mouth at bedtime as needed for cough. 180 mL 0   No current facility-administered medications for this visit.     No Known Allergies  Family History  Problem Relation Age of Onset  . Heart disease Father   . Diabetes Paternal Uncle     Social History   Socioeconomic History  . Marital status: Married    Spouse name: Not on file  . Number of children: Not on file  . Years  of education: Not on file  . Highest education level: Not on file  Occupational History  . Not on file  Social Needs  . Financial resource strain: Not on file  . Food insecurity    Worry: Not on file    Inability: Not on file  . Transportation needs    Medical: Not on file    Non-medical: Not on file  Tobacco Use  . Smoking status: Former Smoker    Packs/day: 1.00    Years: 10.00    Pack years: 10.00    Types: Cigarettes, E-cigarettes    Quit date: 09/09/1985    Years since quitting: 33.3  . Smokeless tobacco: Former Neurosurgeon    Types: Chew    Quit date: 08/11/2015  . Tobacco comment: Last used e Cigarette 09/15/15  Substance and Sexual Activity  . Alcohol use: Yes    Comment: occasional  . Drug use: No  . Sexual activity: Yes  Lifestyle  . Physical activity    Days per week: Not on file    Minutes per session: Not on file  . Stress: Not on file  Relationships  . Social Musician on phone: Not on file    Gets together: Not on file    Attends religious service: Not on file    Active member of club or organization: Not on file    Attends meetings of clubs or organizations: Not on file    Relationship status: Not on file  .  Intimate partner violence    Fear of current or ex partner: Not on file    Emotionally abused: Not on file    Physically abused: Not on file    Forced sexual activity: Not on file  Other Topics Concern  . Not on file  Social History Narrative  . Not on file     Constitutional: Denies fever, malaise, fatigue or abrupt weight changes.  HEENT: Pt reports ear fullness and itchiness. Denies eye pain, eye redness, ear pain, ringing in the ears, wax buildup, runny nose, nasal congestion, bloody nose, or sore throat. Respiratory: Pt reports cough and sputum production. Denies difficulty breathing, shortness of breath.   Cardiovascular: Denies chest pain, chest tightness, palpitations or swelling in the hands or feet.   No other specific  complaints in a complete review of systems (except as listed in HPI above).   Observations/Objective:  Wt Readings from Last 3 Encounters:  04/15/18 197 lb (89.4 kg)  04/12/18 190 lb (86.2 kg)  01/24/18 187 lb (84.8 kg)    General: Appears his stated age, well developed, well nourished in NAD. HEENT: Head: normal shape and size; Eyes: sclera white, no icterus,  and EOMs intact Pulmonary/Chest: Normal effort. No respiratory distress. Neurological: Alert and oriented.   BMET    Component Value Date/Time   NA 140 09/13/2017 1552   K 4.6 09/13/2017 1552   CL 102 09/13/2017 1552   CO2 29 09/13/2017 1552   GLUCOSE 90 09/13/2017 1552   BUN 17 09/13/2017 1552   CREATININE 0.99 09/13/2017 1552   CALCIUM 9.9 09/13/2017 1552    Lipid Panel     Component Value Date/Time   CHOL 243 (H) 09/13/2017 1552   TRIG 120.0 09/13/2017 1552   HDL 43.80 09/13/2017 1552   CHOLHDL 6 09/13/2017 1552   VLDL 24.0 09/13/2017 1552   LDLCALC 176 (H) 09/13/2017 1552    CBC    Component Value Date/Time   WBC 7.1 09/13/2017 1552   RBC 5.46 09/13/2017 1552   HGB 16.3 09/13/2017 1552   HCT 47.4 09/13/2017 1552   PLT 275.0 09/13/2017 1552   MCV 86.8 09/13/2017 1552   MCHC 34.4 09/13/2017 1552   RDW 13.6 09/13/2017 1552    Hgb A1C Lab Results  Component Value Date   HGBA1C 5.5 09/07/2016        Assessment and Plan: Cough, Bilateral Ear Fullness  DDX: Viral UR vs allergies  Flonase 2 times a day for 3 days and then daily 4 days.  Zyrtec at bedtime   If not improving in one week, call back. Sooner, if needed.     Follow Up Instructions:    I discussed the assessment and treatment plan with the patient. The patient was provided an opportunity to ask questions and all were answered. The patient agreed with the plan and demonstrated an understanding of the instructions.   The patient was advised to call back or seek an in-person evaluation if the symptoms worsen or if the condition  fails to improve as anticipated.    Webb Silversmith, NP

## 2019-01-08 NOTE — Patient Instructions (Signed)

## 2019-09-08 ENCOUNTER — Ambulatory Visit: Payer: BC Managed Care – PPO | Admitting: Family Medicine

## 2019-09-08 ENCOUNTER — Ambulatory Visit (INDEPENDENT_AMBULATORY_CARE_PROVIDER_SITE_OTHER)
Admission: RE | Admit: 2019-09-08 | Discharge: 2019-09-08 | Disposition: A | Discharge status: Home / Self Care | Payer: BC Managed Care – PPO | Source: Ambulatory Visit | Attending: Family Medicine | Admitting: Family Medicine

## 2019-09-08 ENCOUNTER — Encounter: Payer: Self-pay | Admitting: Family Medicine

## 2019-09-08 VITALS — BP 116/74 | HR 75 | Temp 97.8°F | Ht 68.5 in | Wt 176.5 lb

## 2019-09-08 DIAGNOSIS — R109 Unspecified abdominal pain: Secondary | ICD-10-CM

## 2019-09-08 DIAGNOSIS — M549 Dorsalgia, unspecified: Secondary | ICD-10-CM | POA: Diagnosis not present

## 2019-09-08 LAB — POC URINALSYSI DIPSTICK (AUTOMATED)
Bilirubin, UA: NEGATIVE
Glucose, UA: NEGATIVE
Ketones, UA: NEGATIVE
Leukocytes, UA: NEGATIVE
Nitrite, UA: NEGATIVE
Protein, UA: NEGATIVE
Spec Grav, UA: 1.015 (ref 1.010–1.025)
Urobilinogen, UA: 0.2 E.U./dL
pH, UA: 6 (ref 5.0–8.0)

## 2019-09-08 MED ORDER — TAMSULOSIN HCL 0.4 MG PO CAPS: 0.4000 mg | ORAL_CAPSULE | Freq: Every day | ORAL | 1 refills | Status: DC

## 2019-09-08 NOTE — Progress Notes (Signed)
This visit occurred during the SARS-CoV-2 public health emergency.  Safety protocols were in place, including screening questions prior to the visit, additional usage of staff PPE, and extensive cleaning of exam room while observing appropriate contact time as indicated for disinfecting solutions.  Woke up late 09/05/19.  L sided back pain.  Tried to drink water but vomited that up from the pain.  Then the pain eased off.  Then next day had lower abd discomfort.  Then sharp pains in the abdomen.  Sometimes w/o any pain, sometimes with sig pain.  Had sig pain this AM but not now.  Urgency to urinate/incomplete voiding.  No known fever but had chills.  Pain is never RUQ.  No blood in urine.  Burning with urination once on 09/05/19.   No blood in stools.  Normal BMs except for mild constipation recently.    No h/o similar.  Brother with h/o renal stones.    Meds, vitals, and allergies reviewed.   ROS: Per HPI unless specifically indicated in ROS section   GEN: nad, alert and oriented HEENT: ncat NECK: supple w/o LA CV: rrr. PULM: ctab, no inc wob ABD: soft, +bs, not tender to palpation at time of exam EXT: no edema SKIN: no acute rash

## 2019-09-08 NOTE — Patient Instructions (Addendum)
Go to the lab on the way out to get an xray done.   If you have mychart we'll likely use that to update you.    We can strain your urine if you have another episode.  Drink enough fluid to keep your urine clear.  Take care.  Glad to see you. Ibuprofen with food if needed.

## 2019-09-10 ENCOUNTER — Ambulatory Visit: Payer: BC Managed Care – PPO | Admitting: Internal Medicine

## 2019-09-10 DIAGNOSIS — R109 Unspecified abdominal pain: Secondary | ICD-10-CM | POA: Insufficient documentation

## 2019-09-10 NOTE — Assessment & Plan Note (Signed)
Waxing and waning pain recently, suggestive of a renal stone.  Discussed options.  No pain now.  Okay for outpatient follow-up.  Urinalysis discussed with patient.  See notes on KUB. Presume renal stone. We can have him strain his urine if you have another episode.  Drink enough fluid to keep your urine clear.  Start Flomax in the meantime with routine cautions. Ibuprofen with food if needed.  Update [place] as needed.  He agrees.

## 2019-09-23 ENCOUNTER — Ambulatory Visit: Payer: BC Managed Care – PPO | Admitting: Family Medicine

## 2019-09-23 ENCOUNTER — Encounter: Payer: Self-pay | Admitting: Family Medicine

## 2019-09-23 ENCOUNTER — Other Ambulatory Visit: Payer: Self-pay

## 2019-09-23 VITALS — BP 120/70 | HR 69 | Temp 98.3°F | Ht 68.5 in | Wt 180.5 lb

## 2019-09-23 DIAGNOSIS — N200 Calculus of kidney: Secondary | ICD-10-CM | POA: Diagnosis not present

## 2019-09-23 DIAGNOSIS — R350 Frequency of micturition: Secondary | ICD-10-CM

## 2019-09-23 LAB — POC URINALSYSI DIPSTICK (AUTOMATED)
Bilirubin, UA: NEGATIVE
Glucose, UA: NEGATIVE
Ketones, UA: NEGATIVE
Leukocytes, UA: NEGATIVE
Nitrite, UA: NEGATIVE
Protein, UA: NEGATIVE
Spec Grav, UA: 1.02 (ref 1.010–1.025)
Urobilinogen, UA: 0.2 E.U./dL
pH, UA: 6.5 (ref 5.0–8.0)

## 2019-09-23 NOTE — Patient Instructions (Signed)
Kidney stone is passing or passed.  Strain urine. Continue flomax and push water.  We will send urine for culture to verify no infection, but urinalysis in office does NOT reflect infection.

## 2019-09-23 NOTE — Progress Notes (Signed)
Chief Complaint  Patient presents with  . Urinary Frequency  . Burning with Urination    History of Present Illness: HPI  54 year old male patient of Rene Kocher Baity's  presents with new onset dysuria and increase in frequency.   He saw Dr. Para March on 09/08/19 for waxing and waning left  flank pain.  Straining urine, started on flomax, ibuprofen.  KUB from 09/08/2019 reviewed: IMPRESSION: Two small calcifications in the inferior left pelvis. These could represent phleboliths or a phlebolith and a distal left ureteral Calculus.  He has not seen that he passed anything. No further pain in low back/flank.  Pain improved central lower abdomen last night, improved flow.  Feels like he has urinary pressure and has to urinate constantly.  Has been having burning with urination.  Urine clear except trace blood today.  This visit occurred during the SARS-CoV-2 public health emergency.  Safety protocols were in place, including screening questions prior to the visit, additional usage of staff PPE, and extensive cleaning of exam room while observing appropriate contact time as indicated for disinfecting solutions.   COVID 19 screen:  No recent travel or known exposure to COVID19 The patient denies respiratory symptoms of COVID 19 at this time. The importance of social distancing was discussed today.     Review of Systems  Constitutional: Negative for chills and fever.  HENT: Negative for congestion and ear pain.   Eyes: Negative for pain and redness.  Respiratory: Negative for cough and shortness of breath.   Cardiovascular: Negative for chest pain, palpitations and leg swelling.  Gastrointestinal: Negative for abdominal pain, blood in stool, constipation, diarrhea, nausea and vomiting.  Genitourinary: Positive for dysuria.  Musculoskeletal: Negative for falls and myalgias.  Skin: Negative for rash.  Neurological: Negative for dizziness.  Psychiatric/Behavioral: Negative for depression.  The patient is not nervous/anxious.       Past Medical History:  Diagnosis Date  . GERD (gastroesophageal reflux disease)   . Hx MRSA infection 2012  . Lumbar back pain with radiculopathy affecting right lower extremity    hx of multiple bulging discs with sciatica, sees Dr. Newell Coral [facility] neurosurgery)    reports that he quit smoking about 34 years ago. His smoking use included cigarettes and e-cigarettes. He has a 10.00 pack-year smoking history. He quit smokeless tobacco use about 4 years ago.  His smokeless tobacco use included chew. He reports current alcohol use. He reports that he does not use drugs.   Current Outpatient Medications:  .  tamsulosin (FLOMAX) 0.4 MG CAPS capsule, Take 1 capsule (0.4 mg total) by mouth daily., Disp: 30 capsule, Rfl: 1   Observations/Objective: Blood pressure 120/70, pulse 69, temperature 98.3 F (36.8 C), temperature source Temporal, height 5' 8.5" (1.74 m), weight 180 lb 8 oz (81.9 kg), SpO2 96 %.  Physical Exam Constitutional:      Appearance: He is well-developed.  HENT:     Head: Normocephalic.     Right Ear: Hearing normal.     Left Ear: Hearing normal.     Nose: Nose normal.  Neck:     Thyroid: No thyroid mass or thyromegaly.     Vascular: No carotid bruit.     Trachea: Trachea normal.  Cardiovascular:     Rate and Rhythm: Normal rate and regular rhythm.     Pulses: Normal pulses.     Heart sounds: Heart sounds not distant. No murmur heard.  No friction rub. No gallop.      Comments: No  peripheral edema Pulmonary:     Effort: Pulmonary effort is normal. No respiratory distress.     Breath sounds: Normal breath sounds.  Abdominal:     Tenderness: There is abdominal tenderness in the suprapubic area and left lower quadrant.  Skin:    General: Skin is warm and dry.     Findings: No rash.  Psychiatric:        Speech: Speech normal.        Behavior: Behavior normal.        Thought Content: Thought content normal.       Assessment and Plan   Kidney stone on left side Likely stone passing or passed leaving irritation in left.  Continue increase water, flomax and straining.  We will send for culture but does not clearly look like infection.     Kerby Nora, MD

## 2019-09-23 NOTE — Addendum Note (Signed)
Addended by: Damita Lack on: 09/23/2019 04:01 PM   Modules accepted: Orders

## 2019-09-23 NOTE — Assessment & Plan Note (Signed)
Likely stone passing or passed leaving irritation in left.  Continue increase water, flomax and straining.  We will send for culture but does not clearly look like infection.

## 2019-09-25 LAB — URINE CULTURE
MICRO NUMBER:: 10781586
Result:: NO GROWTH
SPECIMEN QUALITY:: ADEQUATE

## 2019-10-20 ENCOUNTER — Other Ambulatory Visit: Payer: Self-pay

## 2019-10-20 ENCOUNTER — Ambulatory Visit (INDEPENDENT_AMBULATORY_CARE_PROVIDER_SITE_OTHER): Payer: BC Managed Care – PPO | Admitting: Internal Medicine

## 2019-10-20 ENCOUNTER — Encounter: Payer: Self-pay | Admitting: Internal Medicine

## 2019-10-20 VITALS — BP 138/90 | HR 65 | Temp 97.9°F | Ht 68.0 in | Wt 171.0 lb

## 2019-10-20 DIAGNOSIS — M545 Low back pain, unspecified: Secondary | ICD-10-CM

## 2019-10-20 DIAGNOSIS — Z1211 Encounter for screening for malignant neoplasm of colon: Secondary | ICD-10-CM

## 2019-10-20 DIAGNOSIS — Z0000 Encounter for general adult medical examination without abnormal findings: Secondary | ICD-10-CM | POA: Diagnosis not present

## 2019-10-20 DIAGNOSIS — Z125 Encounter for screening for malignant neoplasm of prostate: Secondary | ICD-10-CM

## 2019-10-20 DIAGNOSIS — G8929 Other chronic pain: Secondary | ICD-10-CM

## 2019-10-20 DIAGNOSIS — E7800 Pure hypercholesterolemia, unspecified: Secondary | ICD-10-CM | POA: Diagnosis not present

## 2019-10-20 DIAGNOSIS — R030 Elevated blood-pressure reading, without diagnosis of hypertension: Secondary | ICD-10-CM | POA: Diagnosis not present

## 2019-10-20 DIAGNOSIS — K219 Gastro-esophageal reflux disease without esophagitis: Secondary | ICD-10-CM | POA: Diagnosis not present

## 2019-10-20 NOTE — Assessment & Plan Note (Signed)
CMET and Lipid profile today °Encouraged him to consume a low fat diet °

## 2019-10-20 NOTE — Patient Instructions (Signed)

## 2019-10-20 NOTE — Progress Notes (Signed)
Subjective:    Patient ID: Russell Diaz, male    DOB: May 02, 1965, 54 y.o.   MRN: [id number]  HPI  Patient presents the clinic today for his annual exam. He is also due to follow-up chronic conditions.  HLD: His last LDL was 176, 08/2017. He is not taking any cholesterol-lowering medication at this time. He tries to consume a low-fat diet.  GERD: Currently not an issue. He is not taking any medications for reflux at this time. There is no upper GI on file.  Chronic Low Back Main. Improved. He is not currently taking anything OTC for this. He does not follow with [facility].  Elevated Blood Pressures: His BP today is 144/94. He has never been diagnosed with HTN. 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  Flu: 11/2016 Tetanus: 08/2016 Covid: never PSA screening: 08/2017. Colon screening: 09/2016, Cologuard Vision screening: Annually Dentist: Biannually  Diet: He does eat some meat. He consumes fruits and veggies daily. He tries to avoid fried fodds. He drinks mostly water, coffee, tea. Exercise: Walking  Review of Systems      Past Medical History:  Diagnosis Date  . GERD (gastroesophageal reflux disease)   . Hx MRSA infection 2012  . Lumbar back pain with radiculopathy affecting right lower extremity    hx of multiple bulging discs with sciatica, sees Dr. Sherwood Gambler [facility] neuro[facility])    Current Outpatient Medications  Medication Sig Dispense Refill  . tamsulosin (FLOMAX) 0.4 MG CAPS capsule Take 1 capsule (0.4 mg total) by mouth daily. 30 capsule 1   No current facility-administered medications for this visit.    No Known Allergies  Family History  Problem Relation Age of Onset  . Heart disease Father   . Diabetes Paternal Uncle     Social History   Socioeconomic History  . Marital status: Married    Spouse name: Not on file  . Number of children: Not on file    . Years of education: Not on file  . Highest education level: Not on file  Occupational History  . Not on file  Tobacco Use  . Smoking status: Former Smoker    Packs/day: 1.00    Years: 10.00    Pack years: 10.00    Types: Cigarettes, E-cigarettes    Quit date: 09/09/1985    Years since quitting: 34.1  . Smokeless tobacco: Former Systems developer    Types: Chew    Quit date: 08/11/2015  . Tobacco comment: Last used e Cigarette 09/15/15  Vaping Use  . Vaping Use: Every day  Substance and Sexual Activity  . Alcohol use: Yes    Comment: occasional  . Drug use: No  . Sexual activity: Yes  Other Topics Concern  . Not on file  Social History Narrative  . Not on file   Social Determinants of Health   Financial Resource Strain:   . Difficulty of Paying Living Expenses: Not on file  Food Insecurity:   . Worried About Charity fundraiser in the Last Year: Not on file  . Ran Out of Food in the Last Year: Not on file  Transportation Needs:   . Lack of Transportation (Medical): Not on file  . Lack of Transportation (Non-Medical): Not on file  Physical Activity:   . Days of Exercise per Week: Not on file  . Minutes of Exercise per Session: Not on file  Stress:   . Feeling of Stress : Not on file  Social Connections:   . Frequency of Communication with Friends and Family: Not on file  . Frequency of Social Gatherings with Friends and Family: Not on file  . Attends Religious Services: Not on file  . Active Member of Clubs or Organizations: Not on file  . Attends Archivist Meetings: Not on file  . Marital Status: Not on file  Intimate Partner Violence:   . Fear of Current or Ex-Partner: Not on file  . Emotionally Abused: Not on file  . Physically Abused: Not on file  . Sexually Abused: Not on file     Constitutional: Denies fever, malaise, fatigue, headache or abrupt weight changes.  HEENT: Denies eye pain, eye redness, ear pain, ringing in the ears, wax buildup, runny nose,  nasal congestion, bloody nose, or sore throat. Respiratory: Denies difficulty breathing, shortness of breath, cough or sputum production.   Cardiovascular: Denies chest pain, chest tightness, palpitations or swelling in the hands or feet.  Gastrointestinal: Denies abdominal pain, bloating, constipation, diarrhea or blood in the stool.  GU: Denies urgency, frequency, pain with urination, burning sensation, blood in urine, odor or discharge. Musculoskeletal: Pt reports chronic back pain. Denies decrease in range of motion, difficulty with gait, or joint swelling.  Skin: Denies redness, rashes, lesions or ulcercations.  Neurological: Denies dizziness, difficulty with memory, difficulty with speech or problems with balance and coordination.  Psych: Denies anxiety, depression, SI/HI.  No other specific complaints in a complete review of systems (except as listed in HPI above).  Objective:   Physical Exam   BP (!) 144/94   Pulse 65   Temp 97.9 F (36.6 C) (Temporal)   Ht $R'5\' 8"'US$  (1.727 m)   Wt 171 lb (77.6 kg)   SpO2 98%   BMI 26.00 kg/m   Wt Readings from Last 3 Encounters:  09/23/19 180 lb 8 oz (81.9 kg)  09/08/19 176 lb 8 oz (80.1 kg)  04/15/18 197 lb (89.4 kg)    General: Appears his stated age, well developed, well nourished in NAD. Skin: Warm, dry and intact. No rashes noted. HEENT: Head: normal shape and size; Eyes: sclera white, no icterus, conjunctiva pink, PERRLA and EOMs intact;  Neck:  Neck supple, trachea midline. No masses, lumps or thyromegaly present.  Cardiovascular: Normal rate and rhythm. S1,S2 noted.  No murmur, rubs or gallops noted. No JVD or BLE edema. No carotid bruits noted. Pulmonary/Chest: Normal effort and positive vesicular breath sounds. No respiratory distress. No wheezes, rales or ronchi noted.  Abdomen: Soft and nontender. Normal bowel sounds. No distention or masses noted. Liver, spleen and kidneys non palpable. Musculoskeletal: Strength 5/5 BUE/BLE.  No difficulty with gait.  Neurological: Alert and oriented. Cranial nerves II-XII grossly intact. Coordination normal.  Psychiatric: Mood and affect normal. Behavior is normal. Judgment and thought content normal.     BMET    Component Value Date/Time   NA 140 09/13/2017 1552   K 4.6 09/13/2017 1552   CL 102 09/13/2017 1552   CO2 29 09/13/2017 1552   GLUCOSE 90 09/13/2017 1552   BUN 17 09/13/2017 1552   CREATININE 0.99 09/13/2017 1552   CALCIUM 9.9 09/13/2017 1552    Lipid Panel     Component Value Date/Time   CHOL 243 (H) 09/13/2017 1552   TRIG 120.0 09/13/2017 1552   HDL 43.80 09/13/2017 1552   CHOLHDL 6 09/13/2017 1552   VLDL 24.0 09/13/2017 1552   LDLCALC  176 (H) 09/13/2017 1552    CBC    Component Value Date/Time   WBC 7.1 09/13/2017 1552   RBC 5.46 09/13/2017 1552   HGB 16.3 09/13/2017 1552   HCT 47.4 09/13/2017 1552   PLT 275.0 09/13/2017 1552   MCV 86.8 09/13/2017 1552   MCHC 34.4 09/13/2017 1552   RDW 13.6 09/13/2017 1552    Hgb A1C Lab Results  Component Value Date   HGBA1C 5.5 09/07/2016           Assessment & Plan:   Preventative Health Maintenance:  Encouraged him to get a flu shot in the fall Tetanus UTD He declines Covid vaccine Cologuard ordered Encouraged him to consume a balanced diet and exercise regimen Advised him to see an eye doctor and dentist annually We will check CBC, C met, lipid, A1c and PSA today  Elevated Blood Pressure Reading:  Reinforced DASH diet, continue to work on weight loss. Will monitor   RTC in 1 month, BP check  Webb Silversmith, NP This visit occurred during the SARS-CoV-2 public health emergency.  Safety protocols were in place, including screening questions prior to the visit, additional usage of staff PPE, and extensive cleaning of exam room while observing appropriate contact time as indicated for disinfecting solutions.

## 2019-10-20 NOTE — Assessment & Plan Note (Signed)
Stable off meds °Will monitor °

## 2019-10-21 LAB — LIPID PANEL
Cholesterol: 191 mg/dL (ref 0–200)
HDL: 47.3 mg/dL (ref >39.00)
LDL Cholesterol: 127 mg/dL — ABNORMAL HIGH (ref 0–99)
NonHDL: 143.68
Total CHOL/HDL Ratio: 4
Triglycerides: 84 mg/dL (ref 0.0–149.0)
VLDL: 16.8 mg/dL (ref 0.0–40.0)

## 2019-10-21 LAB — CBC
HCT: 46.5 % (ref 39.0–52.0)
Hemoglobin: 15.4 g/dL (ref 13.0–17.0)
MCHC: 33.2 g/dL (ref 30.0–36.0)
MCV: 88.1 fl (ref 78.0–100.0)
Platelets: 269 10*3/uL (ref 150.0–400.0)
RBC: 5.28 Mil/uL (ref 4.22–5.81)
RDW: 13.7 % (ref 11.5–15.5)
WBC: 8.3 10*3/uL (ref 4.0–10.5)

## 2019-10-21 LAB — HEMOGLOBIN A1C: Hgb A1c MFr Bld: 5.6 % (ref 4.6–6.5)

## 2019-10-21 LAB — COMPREHENSIVE METABOLIC PANEL
ALT: 15 U/L (ref 0–53)
AST: 17 U/L (ref 0–37)
Albumin: 4.9 g/dL (ref 3.5–5.2)
Alkaline Phosphatase: 75 U/L (ref 39–117)
BUN: 11 mg/dL (ref 6–23)
CO2: 30 mEq/L (ref 19–32)
Calcium: 10 mg/dL (ref 8.4–10.5)
Chloride: 103 mEq/L (ref 96–112)
Creatinine, Ser: 0.96 mg/dL (ref 0.40–1.50)
GFR: 81.83 mL/min (ref >60.00)
Glucose, Bld: 83 mg/dL (ref 70–99)
Potassium: 4.6 mEq/L (ref 3.5–5.1)
Sodium: 139 mEq/L (ref 135–145)
Total Bilirubin: 0.7 mg/dL (ref 0.2–1.2)
Total Protein: 6.8 g/dL (ref 6.0–8.3)

## 2019-10-21 LAB — PSA: PSA: 0.51 ng/mL (ref 0.10–4.00)

## 2020-02-19 ENCOUNTER — Encounter: Payer: Self-pay | Admitting: Internal Medicine

## 2020-02-19 ENCOUNTER — Telehealth (INDEPENDENT_AMBULATORY_CARE_PROVIDER_SITE_OTHER): Payer: BC Managed Care – PPO | Admitting: Internal Medicine

## 2020-02-19 DIAGNOSIS — R52 Pain, unspecified: Secondary | ICD-10-CM | POA: Diagnosis not present

## 2020-02-19 DIAGNOSIS — R059 Cough, unspecified: Secondary | ICD-10-CM

## 2020-02-19 DIAGNOSIS — R6883 Chills (without fever): Secondary | ICD-10-CM | POA: Diagnosis not present

## 2020-02-19 DIAGNOSIS — R519 Headache, unspecified: Secondary | ICD-10-CM | POA: Diagnosis not present

## 2020-02-19 NOTE — Progress Notes (Signed)
Virtual Visit via Video Note  I connected with Russell Diaz on 02/19/20 at  2:00 PM EST by a video enabled telemedicine application and verified that I am speaking with the correct person using two identifiers.  Location: Patient: Home Provider: Office  Person's participating in this video call: Nicki Reaper, NP-C and Thompson Caul.   I discussed the limitations of evaluation and management by telemedicine and the availability of in person appointments. The patient expressed understanding and agreed to proceed.  History of Present Illness:  Pt headache and cough. This started 2 day ago. The headache was located all over his head, but he reports this has improved. The cough is dry and non productive. He denies dizziness, runny nose, nasal congestion, ear pain, sore throat, loss of taste/smell or SOB. He denies fever or chills but had had body aches. He reports exposure to Covid. He did have a negative Covid test.   Past Medical History:  Diagnosis Date  . GERD (gastroesophageal reflux disease)   . Hx MRSA infection 2012  . Lumbar back pain with radiculopathy affecting right lower extremity    hx of multiple bulging discs with sciatica, sees Dr. Newell Coral [facility])    No current outpatient medications on file.   No current facility-administered medications for this visit.    No Known Allergies  Family History  Problem Relation Age of Onset  . Heart disease Father   . Diabetes Paternal Uncle     Social History   Socioeconomic History  . Marital status: Married    Spouse name: Not on file  . Number of children: Not on file  . Years of education: Not on file  . Highest education level: Not on file  Occupational History  . Not on file  Tobacco Use  . Smoking status: Former Smoker    Packs/day: 1.00    Years: 10.00    Pack years: 10.00    Types: Cigarettes, E-cigarettes    Quit date: 09/09/1985    Years since quitting: 34.4  . Smokeless tobacco: Former  Neurosurgeon    Types: Chew    Quit date: 08/11/2015  . Tobacco comment: Last used e Cigarette 09/15/15  Vaping Use  . Vaping Use: Every day  Substance and Sexual Activity  . Alcohol use: Yes    Comment: occasional  . Drug use: No  . Sexual activity: Yes  Other Topics Concern  . Not on file  Social History Narrative  . Not on file   Social Determinants of Health   Financial Resource Strain: Not on file  Food Insecurity: Not on file  Transportation Needs: Not on file  Physical Activity: Not on file  Stress: Not on file  Social Connections: Not on file  Intimate Partner Violence: Not on file     Constitutional: Pt reports headache. Denies fever, malaise, fatigue, or abrupt weight changes.  HEENT: Denies eye pain, eye redness, ear pain, ringing in the ears, wax buildup, runny nose, nasal congestion, bloody nose, or sore throat. Respiratory: Pt reports cough. Denies difficulty breathing, shortness of breath, or sputum production.   Cardiovascular: Denies chest pain, chest tightness, palpitations or swelling in the hands or feet.   No other specific complaints in a complete review of systems (except as listed in HPI above).  Observations/Objective:   Wt Readings from Last 3 Encounters:  10/20/19 171 lb (77.6 kg)  09/23/19 180 lb 8 oz (81.9 kg)  09/08/19 176 lb 8 oz (80.1 kg)    General:  Appears his stated age, in NAD. HEENT: Head: normal shape and size;  Nose: no congestion noted ; Throat/Mouth: no hoarseness noted. Pulmonary/Chest: Normal effort. No respiratory distress.  Neurological: Alert and oriented.   BMET    Component Value Date/Time   NA 139 10/20/2019 1458   K 4.6 10/20/2019 1458   CL 103 10/20/2019 1458   CO2 30 10/20/2019 1458   GLUCOSE 83 10/20/2019 1458   BUN 11 10/20/2019 1458   CREATININE 0.96 10/20/2019 1458   CALCIUM 10.0 10/20/2019 1458    Lipid Panel     Component Value Date/Time   CHOL 191 10/20/2019 1458   TRIG 84.0 10/20/2019 1458   HDL 47.30  10/20/2019 1458   CHOLHDL 4 10/20/2019 1458   VLDL 16.8 10/20/2019 1458   LDLCALC 127 (H) 10/20/2019 1458    CBC    Component Value Date/Time   WBC 8.3 10/20/2019 1458   RBC 5.28 10/20/2019 1458   HGB 15.4 10/20/2019 1458   HCT 46.5 10/20/2019 1458   PLT 269.0 10/20/2019 1458   MCV 88.1 10/20/2019 1458   MCHC 33.2 10/20/2019 1458   RDW 13.7 10/20/2019 1458    Hgb A1C Lab Results  Component Value Date   HGBA1C 5.6 10/20/2019       Assessment and Plan:  Acute Headache, Cough, Chills and Body Aches:  Likely a mild case of Covid but improving Negative test, likely tested too soon Encouraged rest and fluids No other intervention needed at this time Encouraged masking, frequent handwashing, social distancing and self quarantine until symptoms resolve.  Return precautions discussed  Follow Up Instructions:    I discussed the assessment and treatment plan with the patient. The patient was provided an opportunity to ask questions and all were answered. The patient agreed with the plan and demonstrated an understanding of the instructions.   The patient was advised to call back or seek an in-person evaluation if the symptoms worsen or if the condition fails to improve as anticipated.     Nicki Reaper, NP

## 2020-02-19 NOTE — Patient Instructions (Signed)
COVID-19 COVID-19 is a respiratory infection that is caused by a virus called severe acute respiratory syndrome coronavirus 2 (SARS-CoV-2). The disease is also known as coronavirus disease or novel coronavirus. In some people, the virus may not cause any symptoms. In others, it may cause a serious infection. The infection can get worse quickly and can lead to complications, such as:  Pneumonia, or infection of the lungs.  Acute respiratory distress syndrome or ARDS. This is a condition in which fluid build-up in the lungs prevents the lungs from filling with air and passing oxygen into the blood.  Acute respiratory failure. This is a condition in which there is not enough oxygen passing from the lungs to the body or when carbon dioxide is not passing from the lungs out of the body.  Sepsis or septic shock. This is a serious bodily reaction to an infection.  Blood clotting problems.  Secondary infections due to bacteria or fungus.  Organ failure. This is when your body's organs stop working. The virus that causes COVID-19 is contagious. This means that it can spread from person to person through droplets from coughs and sneezes (respiratory secretions). What are the causes? This illness is caused by a virus. You may catch the virus by:  Breathing in droplets from an infected person. Droplets can be spread by a person breathing, speaking, singing, coughing, or sneezing.  Touching something, like a table or a doorknob, that was exposed to the virus (contaminated) and then touching your mouth, nose, or eyes. What increases the risk? Risk for infection You are more likely to be infected with this virus if you:  Are within 6 feet (2 meters) of a person with COVID-19.  Provide care for or live with a person who is infected with COVID-19.  Spend time in crowded indoor spaces or live in shared housing. Risk for serious illness You are more likely to become seriously ill from the virus if you:   Are 50 years of age or older. The higher your age, the more you are at risk for serious illness.  Live in a nursing home or long-term care facility.  Have cancer.  Have a long-term (chronic) disease such as: ? Chronic lung disease, including chronic obstructive pulmonary disease or asthma. ? A long-term disease that lowers your body's ability to fight infection (immunocompromised). ? Heart disease, including heart failure, a condition in which the arteries that lead to the heart become narrow or blocked (coronary artery disease), a disease which makes the heart muscle thick, weak, or stiff (cardiomyopathy). ? Diabetes. ? Chronic kidney disease. ? Sickle cell disease, a condition in which red blood cells have an abnormal "sickle" shape. ? Liver disease.  Are obese. What are the signs or symptoms? Symptoms of this condition can range from mild to severe. Symptoms may appear any time from 2 to 14 days after being exposed to the virus. They include:  A fever or chills.  A cough.  Difficulty breathing.  Headaches, body aches, or muscle aches.  Runny or stuffy (congested) nose.  A sore throat.  New loss of taste or smell. Some people may also have stomach problems, such as nausea, vomiting, or diarrhea. Other people may not have any symptoms of COVID-19. How is this diagnosed? This condition may be diagnosed based on:  Your signs and symptoms, especially if: ? You live in an area with a COVID-19 outbreak. ? You recently traveled to or from an area where the virus is common. ? You   provide care for or live with a person who was diagnosed with COVID-19. ? You were exposed to a person who was diagnosed with COVID-19.  A physical exam.  Lab tests, which may include: ? Taking a sample of fluid from the back of your nose and throat (nasopharyngeal fluid), your nose, or your throat using a swab. ? A sample of mucus from your lungs (sputum). ? Blood tests.  Imaging tests, which  may include, X-rays, CT scan, or ultrasound. How is this treated? At present, there is no medicine to treat COVID-19. Medicines that treat other diseases are being used on a trial basis to see if they are effective against COVID-19. Your health care provider will talk with you about ways to treat your symptoms. For most people, the infection is mild and can be managed at home with rest, fluids, and over-the-counter medicines. Treatment for a serious infection usually takes places in a hospital intensive care unit (ICU). It may include one or more of the following treatments. These treatments are given until your symptoms improve.  Receiving fluids and medicines through an IV.  Supplemental oxygen. Extra oxygen is given through a tube in the nose, a face mask, or a hood.  Positioning you to lie on your stomach (prone position). This makes it easier for oxygen to get into the lungs.  Continuous positive airway pressure (CPAP) or bi-level positive airway pressure (BPAP) machine. This treatment uses mild air pressure to keep the airways open. A tube that is connected to a motor delivers oxygen to the body.  Ventilator. This treatment moves air into and out of the lungs by using a tube that is placed in your windpipe.  Tracheostomy. This is a procedure to create a hole in the neck so that a breathing tube can be inserted.  Extracorporeal membrane oxygenation (ECMO). This procedure gives the lungs a chance to recover by taking over the functions of the heart and lungs. It supplies oxygen to the body and removes carbon dioxide. Follow these instructions at home: Lifestyle  If you are sick, stay home except to get medical care. Your health care provider will tell you how long to stay home. Call your health care provider before you go for medical care.  Rest at home as told by your health care provider.  Do not use any products that contain nicotine or tobacco, such as cigarettes, e-cigarettes, and  chewing tobacco. If you need help quitting, ask your health care provider.  Return to your normal activities as told by your health care provider. Ask your health care provider what activities are safe for you. General instructions  Take over-the-counter and prescription medicines only as told by your health care provider.  Drink enough fluid to keep your urine pale yellow.  Keep all follow-up visits as told by your health care provider. This is important. How is this prevented?  There is no vaccine to help prevent COVID-19 infection. However, there are steps you can take to protect yourself and others from this virus. To protect yourself:   Do not travel to areas where COVID-19 is a risk. The areas where COVID-19 is reported change often. To identify high-risk areas and travel restrictions, check the CDC travel website: wwwnc.cdc.gov/travel/notices  If you live in, or must travel to, an area where COVID-19 is a risk, take precautions to avoid infection. ? Stay away from people who are sick. ? Wash your hands often with soap and water for 20 seconds. If soap and water   are not available, use an alcohol-based hand sanitizer. ? Avoid touching your mouth, face, eyes, or nose. ? Avoid going out in public, follow guidance from your state and local health authorities. ? If you must go out in public, wear a cloth face covering or face mask. Make sure your mask covers your nose and mouth. ? Avoid crowded indoor spaces. Stay at least 6 feet (2 meters) away from others. ? Disinfect objects and surfaces that are frequently touched every day. This may include:  Counters and tables.  Doorknobs and light switches.  Sinks and faucets.  Electronics, such as phones, remote controls, keyboards, computers, and tablets. To protect others: If you have symptoms of COVID-19, take steps to prevent the virus from spreading to others.  If you think you have a COVID-19 infection, contact your health care  provider right away. Tell your health care team that you think you may have a COVID-19 infection.  Stay home. Leave your house only to seek medical care. Do not use public transport.  Do not travel while you are sick.  Wash your hands often with soap and water for 20 seconds. If soap and water are not available, use alcohol-based hand sanitizer.  Stay away from other members of your household. Let healthy household members care for children and pets, if possible. If you have to care for children or pets, wash your hands often and wear a mask. If possible, stay in your own room, separate from others. Use a different bathroom.  Make sure that all people in your household wash their hands well and often.  Cough or sneeze into a tissue or your sleeve or elbow. Do not cough or sneeze into your hand or into the air.  Wear a cloth face covering or face mask. Make sure your mask covers your nose and mouth. Where to find more information  Centers for Disease Control and Prevention: www.cdc.gov/coronavirus/2019-ncov/index.html  World Health Organization: www.who.int/health-topics/coronavirus Contact a health care provider if:  You live in or have traveled to an area where COVID-19 is a risk and you have symptoms of the infection.  You have had contact with someone who has COVID-19 and you have symptoms of the infection. Get help right away if:  You have trouble breathing.  You have pain or pressure in your chest.  You have confusion.  You have bluish lips and fingernails.  You have difficulty waking from sleep.  You have symptoms that get worse. These symptoms may represent a serious problem that is an emergency. Do not wait to see if the symptoms will go away. Get medical help right away. Call your local emergency services (911 in the [place] Do not drive yourself to the hospital. Let the emergency medical personnel know if you think you have COVID-19. Summary  COVID-19 is a  respiratory infection that is caused by a virus. It is also known as coronavirus disease or novel coronavirus. It can cause serious infections, such as pneumonia, acute respiratory distress syndrome, acute respiratory failure, or sepsis.  The virus that causes COVID-19 is contagious. This means that it can spread from person to person through droplets from breathing, speaking, singing, coughing, or sneezing.  You are more likely to develop a serious illness if you are 50 years of age or older, have a weak immune system, live in a nursing home, or have chronic disease.  There is no medicine to treat COVID-19. Your health care provider will talk with you about ways to treat your symptoms.    Take steps to protect yourself and others from infection. Wash your hands often and disinfect objects and surfaces that are frequently touched every day. Stay away from people who are sick and wear a mask if you are sick. This information is not intended to replace advice given to you by your health care provider. Make sure you discuss any questions you have with your health care provider. Document Revised: 12/06/2018 Document Reviewed: 03/14/2018 Elsevier Patient Education  2020 [employer]  

## 2020-04-22 ENCOUNTER — Ambulatory Visit: Payer: BC Managed Care – PPO | Admitting: Internal Medicine

## 2020-04-29 ENCOUNTER — Ambulatory Visit: Payer: BC Managed Care – PPO | Admitting: Internal Medicine

## 2020-04-29 ENCOUNTER — Other Ambulatory Visit: Payer: Self-pay

## 2020-04-29 ENCOUNTER — Encounter: Payer: Self-pay | Admitting: Internal Medicine

## 2020-04-29 VITALS — BP 134/82 | HR 60 | Temp 98.0°F | Wt 188.0 lb

## 2020-04-29 DIAGNOSIS — T162XXA Foreign body in left ear, initial encounter: Secondary | ICD-10-CM | POA: Diagnosis not present

## 2020-04-29 DIAGNOSIS — T20112A Burn of first degree of left ear [any part, except ear drum], initial encounter: Secondary | ICD-10-CM | POA: Diagnosis not present

## 2020-04-29 NOTE — Patient Instructions (Signed)
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
## 2020-10-26 ENCOUNTER — Telehealth: Payer: Self-pay

## 2020-10-26 NOTE — Telephone Encounter (Signed)
Left message for patient to call back to discuss TOC with a new provider or is he planning on following Nicki Reaper to [place].

## 2021-01-11 ENCOUNTER — Other Ambulatory Visit: Payer: Self-pay

## 2021-01-11 ENCOUNTER — Ambulatory Visit: Payer: BC Managed Care – PPO | Admitting: Nurse Practitioner

## 2021-01-11 ENCOUNTER — Ambulatory Visit (INDEPENDENT_AMBULATORY_CARE_PROVIDER_SITE_OTHER)
Admission: RE | Admit: 2021-01-11 | Discharge: 2021-01-11 | Disposition: A | Discharge status: Home / Self Care | Payer: BC Managed Care – PPO | Source: Ambulatory Visit | Attending: Nurse Practitioner | Admitting: Nurse Practitioner

## 2021-01-11 ENCOUNTER — Encounter: Payer: Self-pay | Admitting: Nurse Practitioner

## 2021-01-11 VITALS — BP 144/80 | HR 77 | Temp 98.1°F | Resp 12 | Ht 68.0 in | Wt 189.1 lb

## 2021-01-11 DIAGNOSIS — M542 Cervicalgia: Secondary | ICD-10-CM | POA: Diagnosis not present

## 2021-01-11 DIAGNOSIS — R002 Palpitations: Secondary | ICD-10-CM | POA: Diagnosis not present

## 2021-01-11 DIAGNOSIS — Z23 Encounter for immunization: Secondary | ICD-10-CM | POA: Diagnosis not present

## 2021-01-11 DIAGNOSIS — R0789 Other chest pain: Secondary | ICD-10-CM | POA: Diagnosis not present

## 2021-01-11 DIAGNOSIS — M6289 Other specified disorders of muscle: Secondary | ICD-10-CM

## 2021-01-11 LAB — LIPID PANEL
Cholesterol: 206 mg/dL — ABNORMAL HIGH (ref 0–200)
HDL: 43.3 mg/dL (ref >39.00)
LDL Cholesterol: 139 mg/dL — ABNORMAL HIGH (ref 0–99)
NonHDL: 162.86
Total CHOL/HDL Ratio: 5
Triglycerides: 119 mg/dL (ref 0.0–149.0)
VLDL: 23.8 mg/dL (ref 0.0–40.0)

## 2021-01-11 LAB — CBC
HCT: 44.6 % (ref 39.0–52.0)
Hemoglobin: 15.3 g/dL (ref 13.0–17.0)
MCHC: 34.2 g/dL (ref 30.0–36.0)
MCV: 87.3 fl (ref 78.0–100.0)
Platelets: 224 10*3/uL (ref 150.0–400.0)
RBC: 5.11 Mil/uL (ref 4.22–5.81)
RDW: 13.5 % (ref 11.5–15.5)
WBC: 5.7 10*3/uL (ref 4.0–10.5)

## 2021-01-11 LAB — COMPREHENSIVE METABOLIC PANEL
ALT: 16 U/L (ref 0–53)
AST: 18 U/L (ref 0–37)
Albumin: 4.6 g/dL (ref 3.5–5.2)
Alkaline Phosphatase: 69 U/L (ref 39–117)
BUN: 11 mg/dL (ref 6–23)
CO2: 29 mEq/L (ref 19–32)
Calcium: 9.4 mg/dL (ref 8.4–10.5)
Chloride: 103 mEq/L (ref 96–112)
Creatinine, Ser: 0.95 mg/dL (ref 0.40–1.50)
GFR: 90.7 mL/min (ref >60.00)
Glucose, Bld: 91 mg/dL (ref 70–99)
Potassium: 4.6 mEq/L (ref 3.5–5.1)
Sodium: 139 mEq/L (ref 135–145)
Total Bilirubin: 0.5 mg/dL (ref 0.2–1.2)
Total Protein: 6.4 g/dL (ref 6.0–8.3)

## 2021-01-11 LAB — TSH: TSH: 0.56 u[IU]/mL (ref 0.35–5.50)

## 2021-01-11 MED ORDER — METHOCARBAMOL 500 MG PO TABS: 500.0000 mg | ORAL_TABLET | Freq: Two times a day (BID) | ORAL | 0 refills | Status: AC | PRN

## 2021-01-11 NOTE — Assessment & Plan Note (Signed)
No midline tenderness on palpation.  Given duration and symptoms we will do cervical spine picture today.  Pending x-ray results

## 2021-01-11 NOTE — Assessment & Plan Note (Signed)
Patient with broad chest discomfort and reports of palpitations.  EKG within limits in office and in comparison to one done years ago.  Did disclose that with patient.  Will pend lab work and if normal did discuss sending him to cardiology as he does have heart disease runs on the paternal side of his family.  Patient acknowledged continue to monitor

## 2021-01-11 NOTE — Assessment & Plan Note (Signed)
Patient had muscle tightness to bilateral trapezius muscles we will try him on a low-dose of methocarbamol.  Did discuss with patient supposed to be nondrowsy medication but to take it at home prior to using heavy equipment.  He acknowledged. Start methocarbamol 500 mg twice daily as needed

## 2021-01-11 NOTE — Patient Instructions (Signed)
Nice to see you today Follow up in a couple months for your physical Will be in touch in regards to your xray and lab results

## 2021-01-11 NOTE — Assessment & Plan Note (Signed)
EKG okay in office.  We will await pending lab work.  If benign refer to cardiology
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
## 2021-01-18 ENCOUNTER — Ambulatory Visit: Payer: BC Managed Care – PPO | Admitting: Dermatology

## 2021-01-18 ENCOUNTER — Other Ambulatory Visit: Payer: Self-pay

## 2021-01-18 DIAGNOSIS — L570 Actinic keratosis: Secondary | ICD-10-CM

## 2021-01-18 DIAGNOSIS — L578 Other skin changes due to chronic exposure to nonionizing radiation: Secondary | ICD-10-CM | POA: Diagnosis not present

## 2021-01-18 DIAGNOSIS — I781 Nevus, non-neoplastic: Secondary | ICD-10-CM | POA: Diagnosis not present

## 2021-01-18 NOTE — Progress Notes (Signed)
   New Patient Visit  Subjective  Russell Diaz is a 55 y.o. male who presents for the following: Skin Problem (Check spots on his face ).  He is specifically concerned about finances for him.  He also has a red area around the right eye. No history of skin cancer.  The patient has spots, moles and lesions to be evaluated, some may be new or changing and the patient has concerns that these could be cancer.  The following portions of the chart were reviewed this encounter and updated as appropriate:   Tobacco  Allergies  Meds  Problems  Med Hx  Surg Hx  Fam Hx     Review of Systems:  No other skin or systemic complaints except as noted in HPI or Assessment and Plan.  Objective  Well appearing patient in no apparent distress; mood and affect are within normal limits.  A focused examination was performed including face. Relevant physical exam findings are noted in the Assessment and Plan.  left lateral forehead x 1 Erythematous thin papules/macules with gritty scale.   right lateral canthus Telangectasia    Assessment & Plan  AK (actinic keratosis) left lateral forehead x 1  Actinic keratoses are precancerous spots that appear secondary to cumulative UV radiation exposure/sun exposure over time. They are chronic with expected duration over 1 year. A portion of actinic keratoses will progress to squamous cell carcinoma of the skin. It is not possible to reliably predict which spots will progress to skin cancer and so treatment is recommended to prevent development of skin cancer.  Recommend daily broad spectrum sunscreen SPF 30+ to sun-exposed areas, reapply every 2 hours as needed.  Recommend staying in the shade or wearing long sleeves, sun glasses (UVA+UVB protection) and wide brim hats (4-inch brim around the entire circumference of the hat). Call for new or changing lesions.   Prior to procedure, discussed risks of blister formation, small wound, skin dyspigmentation, or  rare scar following cryotherapy. Recommend Vaseline ointment to treated areas while healing.   Destruction of lesion - left lateral forehead x 1 Complexity: simple   Destruction method: cryotherapy   Informed consent: discussed and consent obtained   Timeout:  patient name, date of birth, surgical site, and procedure verified Lesion destroyed using liquid nitrogen: Yes   Region frozen until ice ball extended beyond lesion: Yes   Outcome: patient tolerated procedure well with no complications   Post-procedure details: wound care instructions given    Telangiectasia right lateral canthus  Observe   Actinic Damage - chronic, secondary to cumulative UV radiation exposure/sun exposure over time - diffuse scaly erythematous macules with underlying dyspigmentation - Recommend daily broad spectrum sunscreen SPF 30+ to sun-exposed areas, reapply every 2 hours as needed.  - Recommend staying in the shade or wearing long sleeves, sun glasses (UVA+UVB protection) and wide brim hats (4-inch brim around the entire circumference of the hat). - Call for new or changing lesions.   Return in about 3 months (around 04/19/2021) for Aks .  IAngelique Holm, CMA, am acting as scribe for Armida Sans, MD .  Documentation: I have reviewed the above documentation for accuracy and completeness, and I agree with the above.  Armida Sans, MD

## 2021-01-18 NOTE — Patient Instructions (Signed)

## 2021-01-25 ENCOUNTER — Encounter: Payer: Self-pay | Admitting: Dermatology

## 2021-04-20 ENCOUNTER — Ambulatory Visit: Payer: BC Managed Care – PPO | Admitting: Dermatology

## 2021-04-22 ENCOUNTER — Encounter: Payer: Self-pay | Admitting: Nurse Practitioner

## 2021-07-12 ENCOUNTER — Ambulatory Visit: Payer: BC Managed Care – PPO | Admitting: Dermatology

## 2021-09-14 ENCOUNTER — Ambulatory Visit: Payer: BC Managed Care – PPO | Admitting: Dermatology

## 2021-10-20 ENCOUNTER — Ambulatory Visit: Payer: BC Managed Care – PPO | Admitting: Dermatology

## 2022-02-08 IMAGING — DX DG CERVICAL SPINE COMPLETE 4+V
6 series · 7 of 7 positions shown · non-contrast
Comparison: None.

CLINICAL DATA: Neck pain for 1 and half years, initial encounter

EXAM:
CERVICAL SPINE - COMPLETE 4+ VIEW

[cervical spine ap]
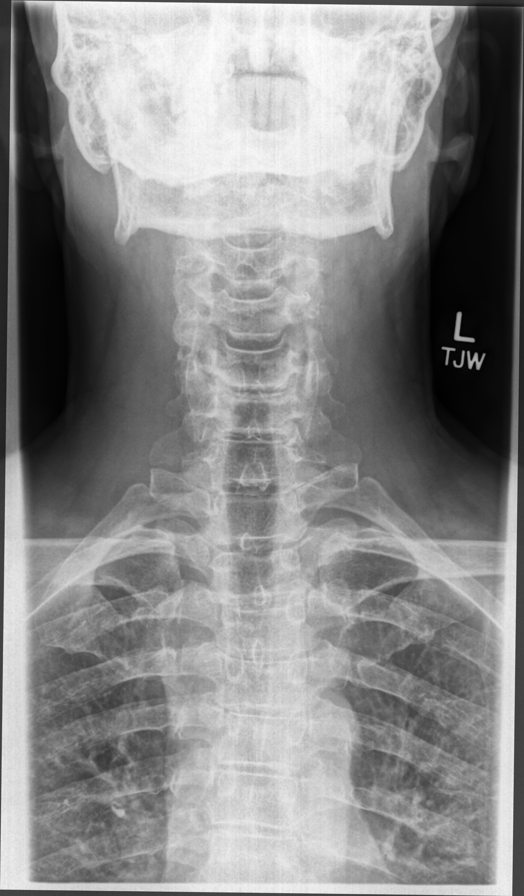

[cervical spine open mouth ap]
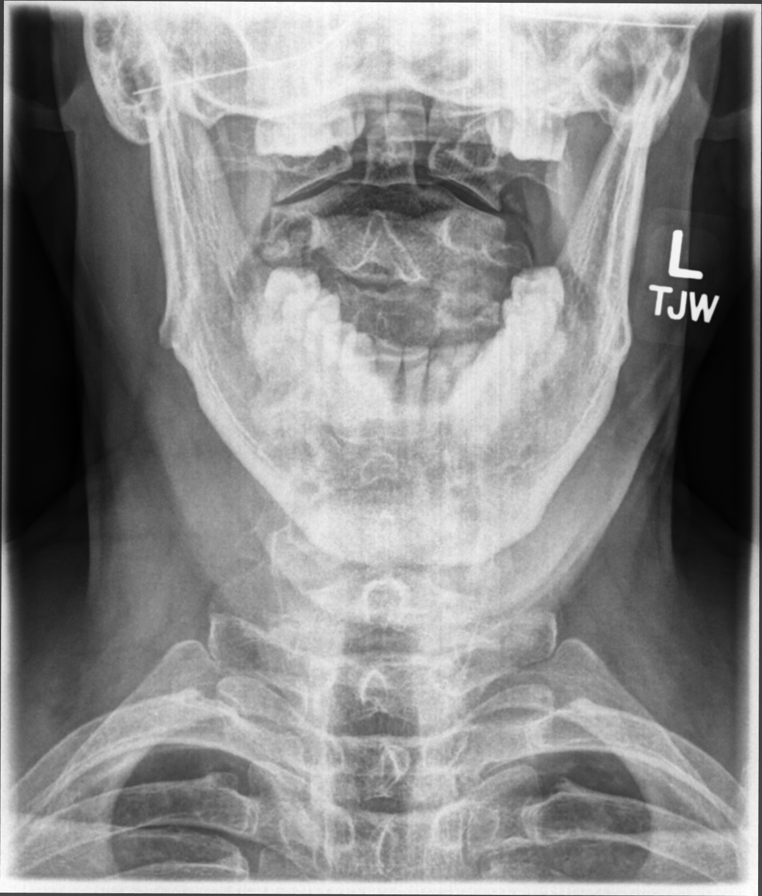

[Series 3: cervical spine oblique · 2 of 2 slices shown (1 of 2)]
[im 1/2]
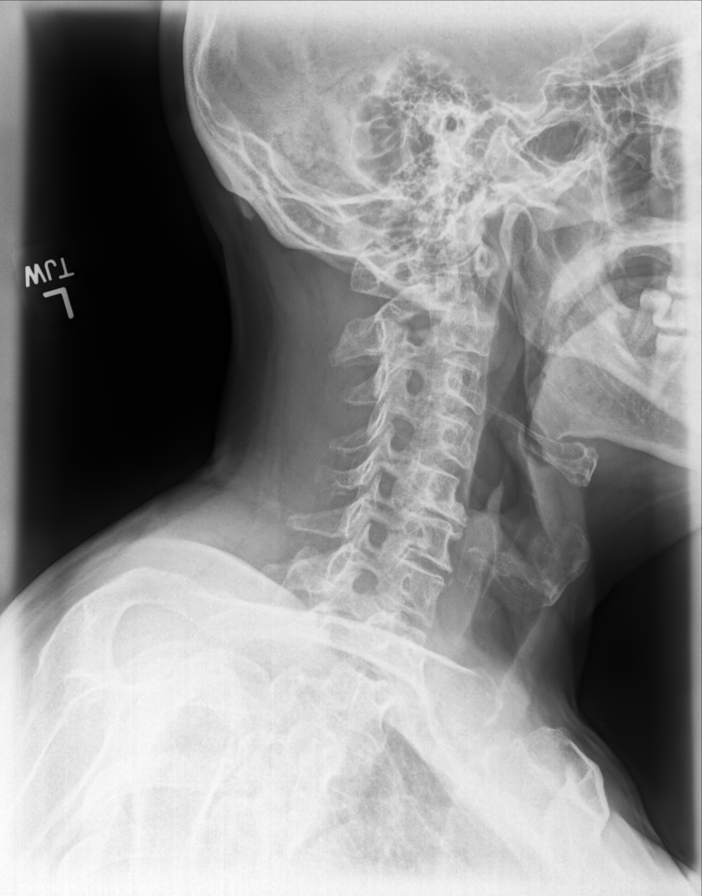
[im 2/2]
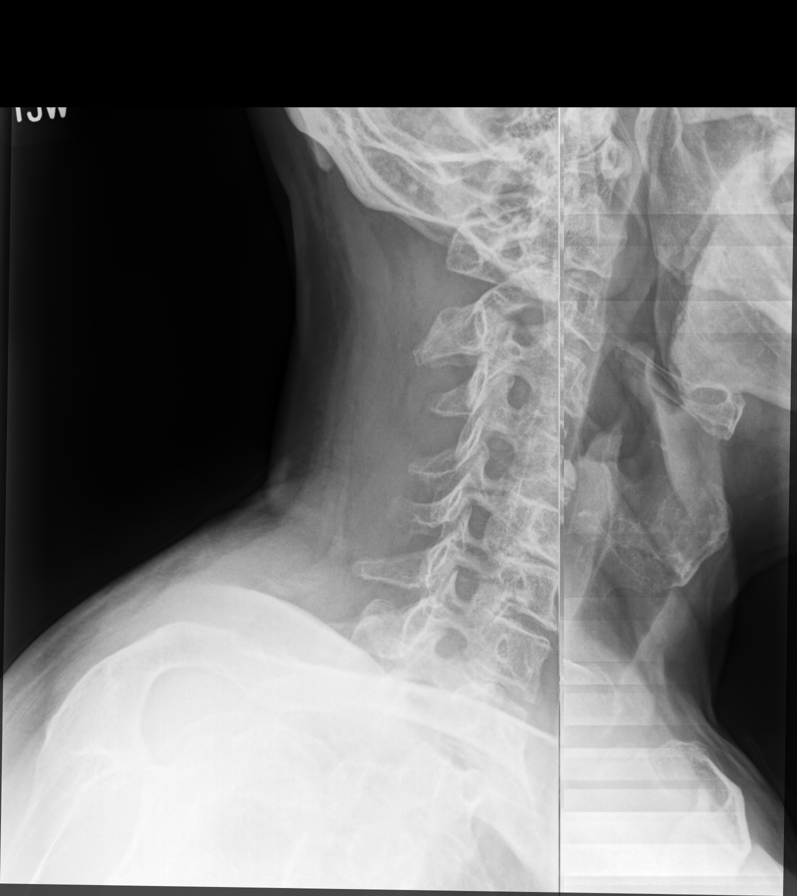

[cervical spine oblique (2 of 2)]
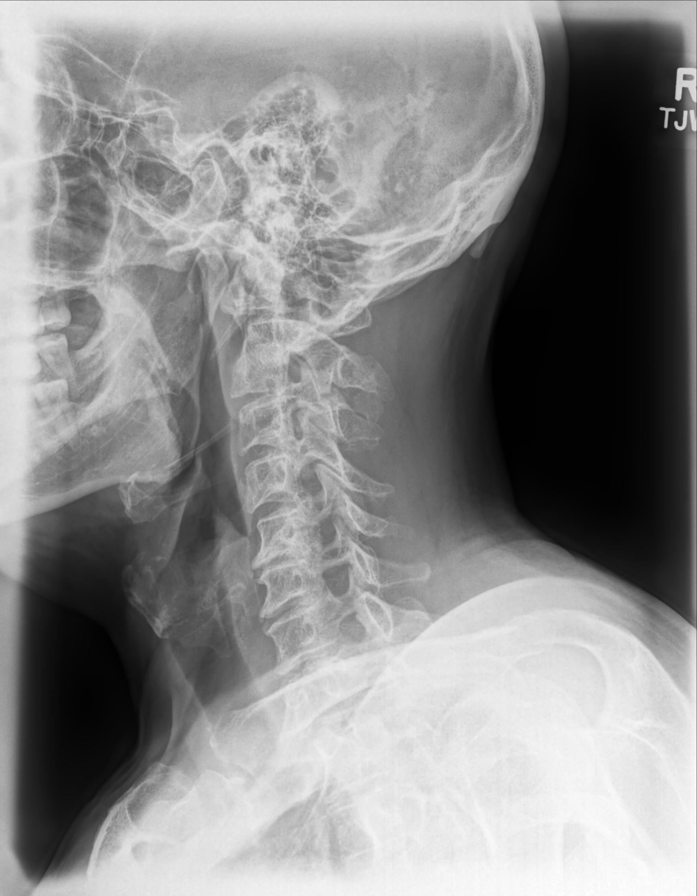

[cervical spine lat]
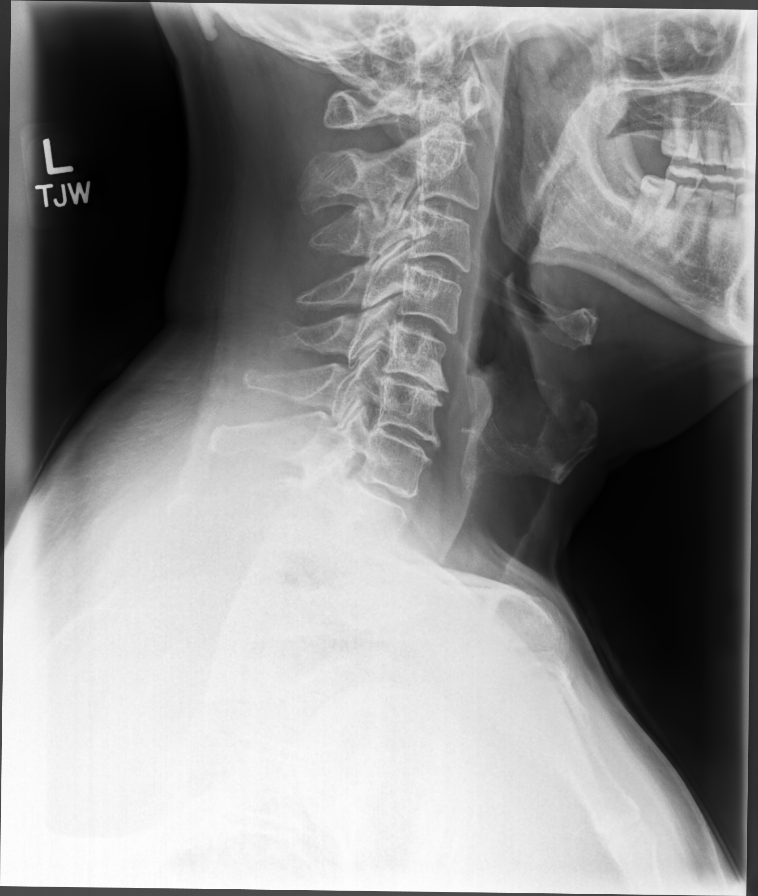

[cervico-thoracic (swimmers) lat]
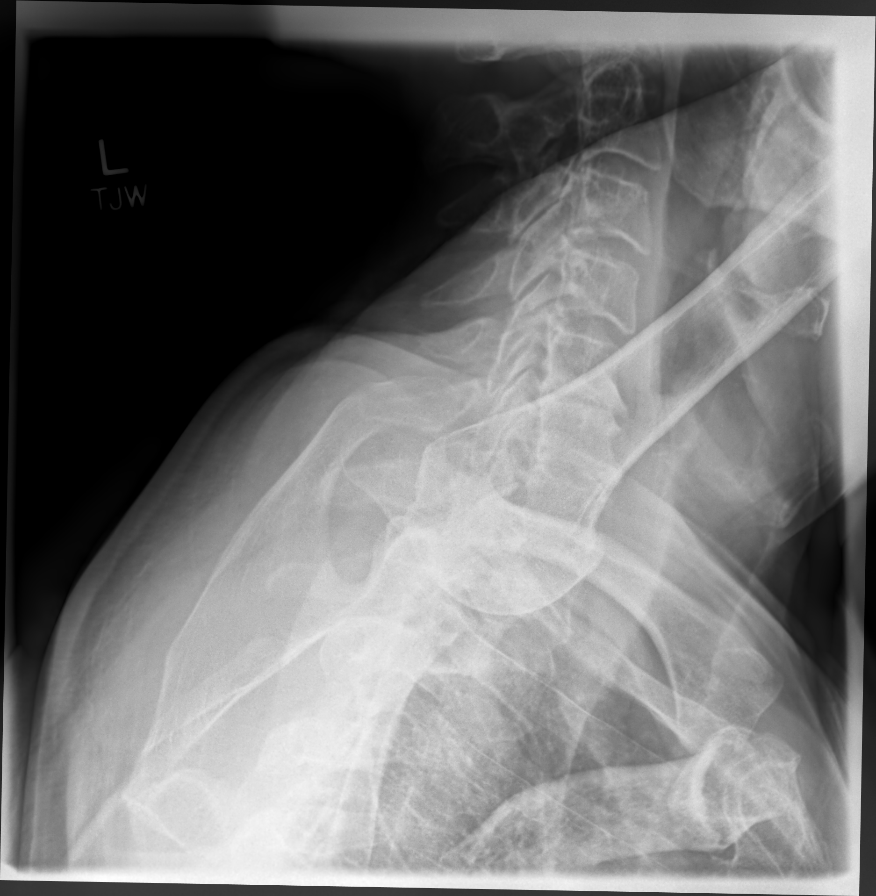

[7 of 7 positions shown; findings below may reference images not displayed]

FINDINGS: Seven cervical segments are well visualized. Vertebral body height
is well maintained. Osteophytic changes are noted at C5-6 and C6-7.
No prevertebral soft tissue abnormality is noted. The neural
foramina are patent bilaterally. The odontoid is within normal
limits.
IMPRESSION: Degenerative change without acute abnormality.
